# Patient Record
Sex: Female | Born: 1960 | Race: White | Hispanic: No | Marital: Single | Smoking: Current every day smoker
Health system: Southern US, Community
[De-identification: ages and names within clinical notes are randomized; demographics above are authoritative.]

## PROBLEM LIST (undated history)

## (undated) DIAGNOSIS — E119 Type 2 diabetes mellitus without complications: Secondary | ICD-10-CM

## (undated) DIAGNOSIS — K589 Irritable bowel syndrome without diarrhea: Secondary | ICD-10-CM

## (undated) DIAGNOSIS — G4731 Primary central sleep apnea: Secondary | ICD-10-CM

## (undated) DIAGNOSIS — M797 Fibromyalgia: Secondary | ICD-10-CM

## (undated) DIAGNOSIS — R5382 Chronic fatigue, unspecified: Secondary | ICD-10-CM

## (undated) DIAGNOSIS — F32A Depression, unspecified: Secondary | ICD-10-CM

## (undated) DIAGNOSIS — G629 Polyneuropathy, unspecified: Secondary | ICD-10-CM

## (undated) DIAGNOSIS — M722 Plantar fascial fibromatosis: Secondary | ICD-10-CM

## (undated) DIAGNOSIS — G8929 Other chronic pain: Secondary | ICD-10-CM

## (undated) DIAGNOSIS — Z8719 Personal history of other diseases of the digestive system: Secondary | ICD-10-CM

## (undated) DIAGNOSIS — I10 Essential (primary) hypertension: Secondary | ICD-10-CM

## (undated) DIAGNOSIS — R682 Dry mouth, unspecified: Secondary | ICD-10-CM

## (undated) DIAGNOSIS — M1990 Unspecified osteoarthritis, unspecified site: Secondary | ICD-10-CM

## (undated) DIAGNOSIS — G2581 Restless legs syndrome: Secondary | ICD-10-CM

## (undated) DIAGNOSIS — C719 Malignant neoplasm of brain, unspecified: Secondary | ICD-10-CM

## (undated) DIAGNOSIS — T4145XA Adverse effect of unspecified anesthetic, initial encounter: Secondary | ICD-10-CM

## (undated) DIAGNOSIS — T8859XA Other complications of anesthesia, initial encounter: Secondary | ICD-10-CM

## (undated) DIAGNOSIS — K760 Fatty (change of) liver, not elsewhere classified: Secondary | ICD-10-CM

## (undated) DIAGNOSIS — J45909 Unspecified asthma, uncomplicated: Secondary | ICD-10-CM

## (undated) DIAGNOSIS — M5030 Other cervical disc degeneration, unspecified cervical region: Secondary | ICD-10-CM

## (undated) DIAGNOSIS — F329 Major depressive disorder, single episode, unspecified: Secondary | ICD-10-CM

## (undated) DIAGNOSIS — F419 Anxiety disorder, unspecified: Secondary | ICD-10-CM

## (undated) DIAGNOSIS — Z8711 Personal history of peptic ulcer disease: Secondary | ICD-10-CM

## (undated) HISTORY — DX: Other cervical disc degeneration, unspecified cervical region: M50.30

## (undated) HISTORY — PX: BIOPSY THYROID: PRO38

## (undated) HISTORY — DX: Dry mouth, unspecified: R68.2

## (undated) HISTORY — PX: BREAST BIOPSY: SHX20

## (undated) HISTORY — PX: REDUCTION MAMMAPLASTY: SUR839

## (undated) HISTORY — PX: ABDOMINAL HYSTERECTOMY: SHX81

## (undated) HISTORY — PX: TUBAL LIGATION: SHX77

## (undated) HISTORY — DX: Chronic fatigue, unspecified: R53.82

## (undated) HISTORY — DX: Personal history of other diseases of the digestive system: Z87.19

## (undated) HISTORY — DX: Malignant neoplasm of brain, unspecified: C71.9

## (undated) HISTORY — DX: Plantar fascial fibromatosis: M72.2

## (undated) HISTORY — DX: Essential (primary) hypertension: I10

## (undated) HISTORY — DX: Major depressive disorder, single episode, unspecified: F32.9

## (undated) HISTORY — DX: Polyneuropathy, unspecified: G62.9

## (undated) HISTORY — DX: Irritable bowel syndrome, unspecified: K58.9

## (undated) HISTORY — DX: Type 2 diabetes mellitus without complications: E11.9

## (undated) HISTORY — DX: Unspecified osteoarthritis, unspecified site: M19.90

## (undated) HISTORY — PX: TONSILLECTOMY: SUR1361

## (undated) HISTORY — DX: Fatty (change of) liver, not elsewhere classified: K76.0

## (undated) HISTORY — DX: Other chronic pain: G89.29

## (undated) HISTORY — DX: Fibromyalgia: M79.7

## (undated) HISTORY — PX: BREAST REDUCTION SURGERY: SHX8

## (undated) HISTORY — DX: Primary central sleep apnea: G47.31

## (undated) HISTORY — DX: Restless legs syndrome: G25.81

## (undated) HISTORY — DX: Depression, unspecified: F32.A

## (undated) HISTORY — DX: Unspecified asthma, uncomplicated: J45.909

## (undated) HISTORY — DX: Personal history of peptic ulcer disease: Z87.11

## (undated) HISTORY — DX: Anxiety disorder, unspecified: F41.9

---

## 1999-01-28 ENCOUNTER — Other Ambulatory Visit: Admission: RE | Admit: 1999-01-28 | Discharge: 1999-01-28 | Payer: Self-pay | Admitting: Obstetrics & Gynecology

## 1999-01-28 ENCOUNTER — Encounter (INDEPENDENT_AMBULATORY_CARE_PROVIDER_SITE_OTHER): Payer: Self-pay | Admitting: *Deleted

## 1999-03-04 ENCOUNTER — Encounter (INDEPENDENT_AMBULATORY_CARE_PROVIDER_SITE_OTHER): Payer: Self-pay

## 1999-03-04 ENCOUNTER — Ambulatory Visit (HOSPITAL_COMMUNITY): Admission: RE | Admit: 1999-03-04 | Discharge: 1999-03-04 | Payer: Self-pay | Admitting: Obstetrics & Gynecology

## 1999-08-24 ENCOUNTER — Inpatient Hospital Stay (HOSPITAL_COMMUNITY): Admission: RE | Admit: 1999-08-24 | Discharge: 1999-08-27 | Payer: Self-pay | Admitting: Obstetrics & Gynecology

## 2000-05-01 ENCOUNTER — Inpatient Hospital Stay (HOSPITAL_COMMUNITY): Admission: EM | Admit: 2000-05-01 | Discharge: 2000-05-02 | Payer: Self-pay | Admitting: *Deleted

## 2004-01-24 ENCOUNTER — Ambulatory Visit: Payer: Self-pay | Admitting: Psychiatry

## 2004-01-24 ENCOUNTER — Other Ambulatory Visit (HOSPITAL_COMMUNITY): Admission: RE | Admit: 2004-01-24 | Discharge: 2004-02-07 | Payer: Self-pay | Admitting: Psychiatry

## 2004-04-23 ENCOUNTER — Other Ambulatory Visit (HOSPITAL_COMMUNITY): Admission: RE | Admit: 2004-04-23 | Discharge: 2004-05-04 | Payer: Self-pay | Admitting: Psychiatry

## 2004-04-23 ENCOUNTER — Ambulatory Visit: Payer: Self-pay | Admitting: Psychiatry

## 2004-06-02 ENCOUNTER — Ambulatory Visit: Payer: Self-pay | Admitting: Psychiatry

## 2004-06-02 ENCOUNTER — Inpatient Hospital Stay (HOSPITAL_COMMUNITY): Admission: RE | Admit: 2004-06-02 | Discharge: 2004-06-10 | Payer: Self-pay | Admitting: Psychiatry

## 2007-03-18 ENCOUNTER — Inpatient Hospital Stay (HOSPITAL_COMMUNITY): Admission: EM | Admit: 2007-03-18 | Discharge: 2007-03-21 | Payer: Self-pay | Admitting: Emergency Medicine

## 2007-03-18 ENCOUNTER — Ambulatory Visit: Payer: Self-pay | Admitting: Family Medicine

## 2007-03-29 ENCOUNTER — Emergency Department (HOSPITAL_COMMUNITY): Admission: EM | Admit: 2007-03-29 | Discharge: 2007-03-29 | Payer: Self-pay | Admitting: Emergency Medicine

## 2009-03-19 IMAGING — CR DG CHEST 1V PORT
1 series · 1 of 1 positions shown · non-contrast
Comparison: None.

CLINICAL DATA: Overdose.  Hypotensive.
 PORTABLE SUPINE CHEST - 1 VIEW - 03/18/07 AT [DATE] P.M.:

[AP]
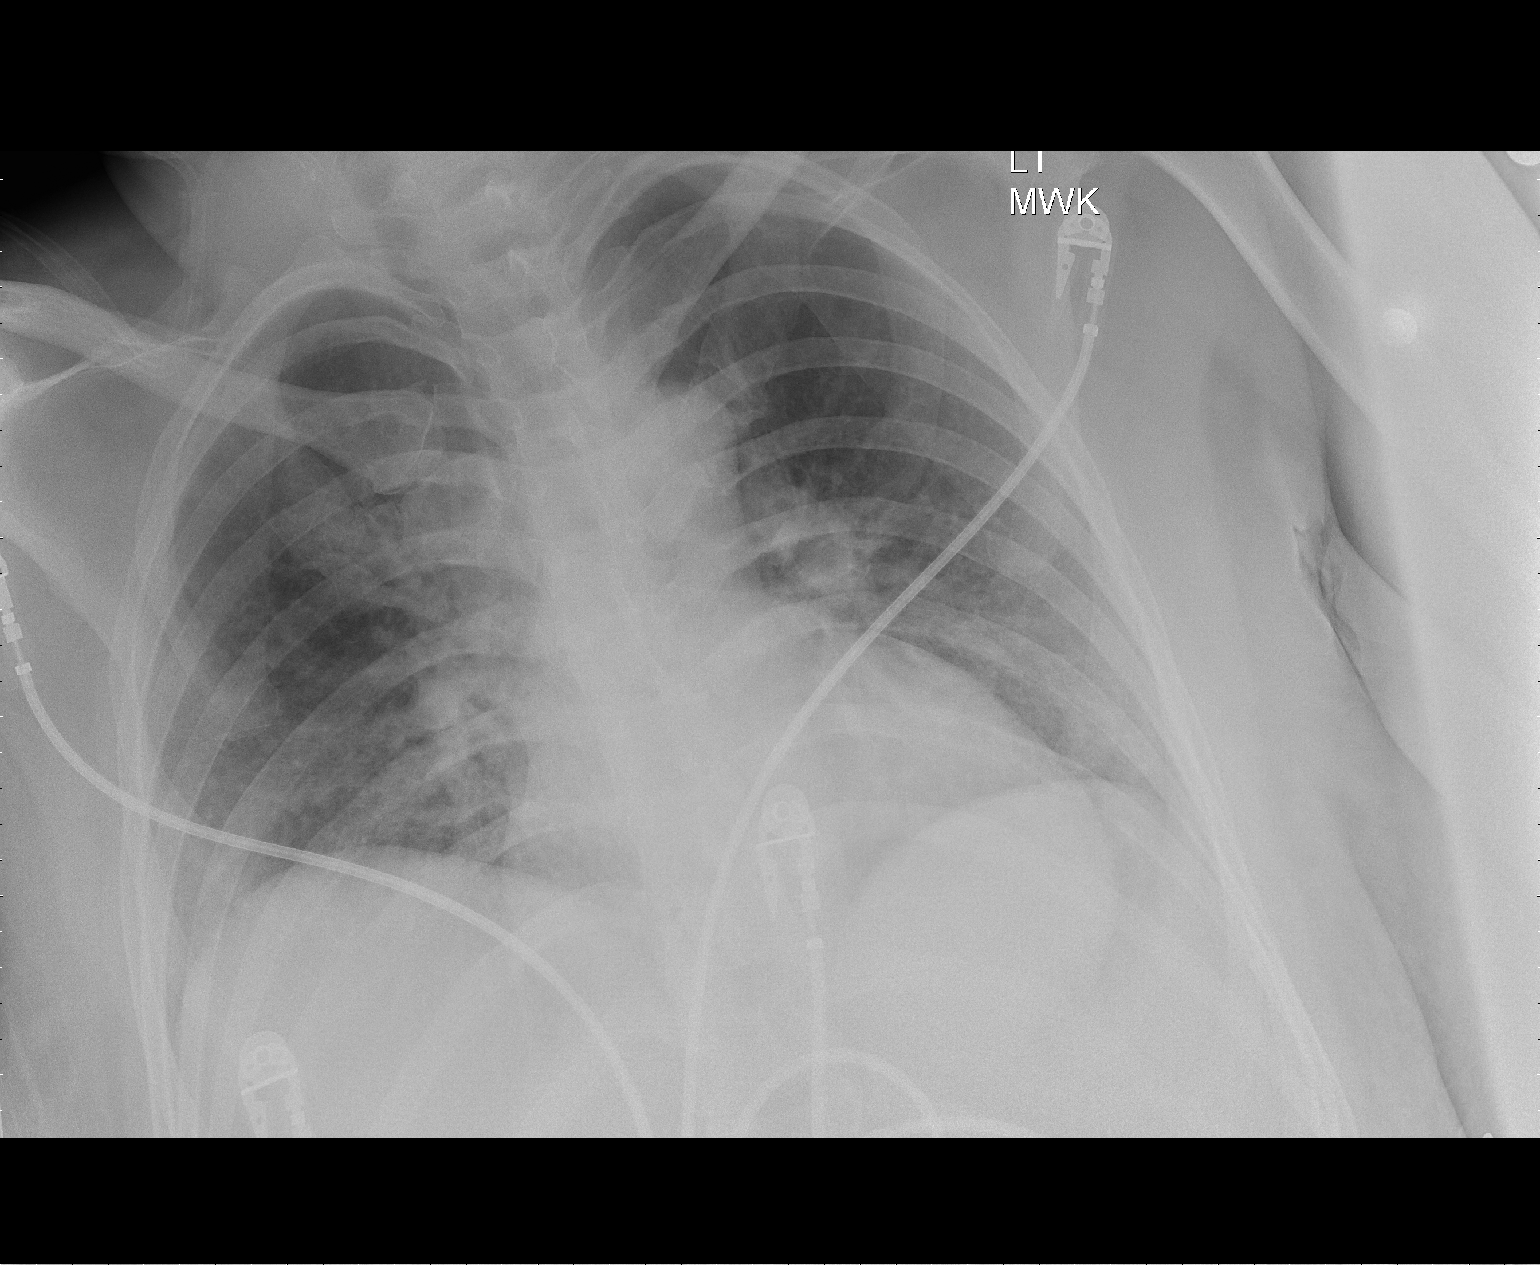

[1 of 1 positions shown; findings below may reference images not displayed]

FINDINGS: Cardiomegaly and mild pulmonary edema.  No gross pneumothorax or segmental region of consolidation.  Recommend follow up until clearance.
IMPRESSION: Poor inspiratory exam with cardiomegaly and mild pulmonary edema most notable centrally.

## 2010-09-01 NOTE — H&P (Signed)
NAMEDENISHIA, CITRO NO.:  [id number]   MEDICAL RECORD NO.:  [id number]          PATIENT TYPE:  EMS   LOCATION:  MAJO                         FACILITY:  [facility]   PHYSICIAN:  Altamese Cabal, M.D.  DATE OF BIRTH:  07-Apr-1961   DATE OF ADMISSION:  03/18/2007  DATE OF DISCHARGE:                              HISTORY & PHYSICAL   PRIMARY CARE PHYSICIAN:  [facility].   CHIEF COMPLAINT:  Overdose.   HISTORY OF PRESENT ILLNESS:  This patient is a 50 year old female with  bipolar disorder, prior suicide attempts, hypertension and type 2  diabetes brought in by EMS after attempting suicide.  She apparently  took about 30 50 mg Seroquel tablets, 30 500 mg metformin ER tablets and  13 5 mg lisinopril tablets around 2:00 p.m. today.  She then called her  mother to say goodbye and her mother called the police.  The patient  left a suicide note.  The patient was alone at the time of the event.  In the emergency room she had an episode of asystole times 4-5 seconds  with spontaneous return to normal sinus rhythm.  So far she has been  given sodium bicarb x1, Narcan x1, mag sulfate x1 and KCl one run.  She  is status post about 2 liters currently on D5 normal saline at 150  cc/hour.   PAST MEDICAL HISTORY:  1. Hypertension.  2. Type 2 diabetes.  3. Bipolar disorder.  4. Suicide attempts with overdose in February 2006.   MEDICATIONS:  1. Clonazepam 2 mg p.o. t.i.d.  2. Skelaxin 800 mg one half to one tablet p.o. t.i.d.  3. Omeprazole 20 mg p.o. b.i.d.  4. Zoloft 200 mg p.o. q.a.m.  5. Bupropion XL 300 mg p.o. q.a.m.  6. Lamictal 300 mg p.o. q.a.m.  7. Aspirin 81 mg p.o. daily.  8. Seroquel 50 mg two to six tablets p.o. nightly.  9. Naprosyn 500 mg p.o. b.i.d.  10.Xanax 1 mg p.o. b.i.d.  11.Metformin ER 500 mg p.o. b.i.d.  12.Lisinopril 5 mg p.o. daily.  13.Topamax 25 mg p.o. nightly.   ALLERGIES:  SULFA DRUGS.   FAMILY HISTORY:  Unobtainable.   SOCIAL  HISTORY:  She lives with two teenage sons who were out of town  this weekend.  Unable to get any more history from the patient.  She has  a mother who lives in [place].   PHYSICAL EXAM:  VITAL SIGNS: Temperature 98.5, blood pressure 82-102/44-  69, pulse 68-80, respirations 16-18, O2 96% on 2 liters.  GENERAL:  The patient is sleepy but arousable.  Her speech is slurred  but she is oriented x3.  HEENT: Head normocephalic atraumatic.  Pupils equal, round and reactive  to light and accommodation.  Extraocular movements intact.  Dry mucous  membranes.  CARDIOVASCULAR:  Regular rate and rhythm.  No murmurs, rubs or gallops.  LUNGS:  Clear to auscultation bilaterally.  ABDOMEN:  Positive bowel sounds.  Soft, nontender, nondistended.  EXTREMITIES: No clubbing, cyanosis or edema.  NEURO:  She follows simple commands.  She is moving all her  extremities.   LABS AND STUDIES:  White count 9.1, hemoglobin 13.0, hematocrit 37.4,  platelets 263.  Sodium 139, potassium 3.1, chloride 109, bicarb 25, BUN  8, creatinine 0.86, glucose 119.  Salicylates less than 4.0.  Alcohol  level less than 5.  UDS pending.  Tricyclics pending.  CBG 138-80-87.   ASSESSMENT/PLAN:  This is a 50 year old female status post overdose.  1. Overdose.  We will admit her to the step-down unit.  We need to      closely monitor her cardiac status on telemetry.  We will repeat an      EKG tonight.  Check a lactic acid level and ABG now.  Check      ___BMET_______  every three hours until stable.  Need to watch the      patient for seizures, lactic acidosis, worsening hypotension and      respiratory depression.  2. Hypotension.  The patient is status post 2 liters.  Monitor her      closely.  Bolus as needed.  Start dopamine drip if not responding      to fluids.  3. Hypoxemia.  This is likely due to respiratory depression from      Seroquel.  Continue O2 to keep saturation greater than 92%.  4. Hypoglycemia.  Will check CBG  every hour and then space them out if      they are stable.  Continue D5 normal saline.  5. Hypokalemia.  We will replete this and recheck this.  6. Fluid, electrolytes and nutrition.  The patient be n.p.o.  7. Prophylaxis.  Will put SCDs to her lower extremities.      Altamese Cabal, M.D.  Electronically Signed     KS/MEDQ  D:  03/18/2007  T:  03/19/2007  Job:  [id number]

## 2010-09-01 NOTE — Discharge Summary (Signed)
Tami Turner, Tami Turner                ACCOUNT NO.:  [id number]   MEDICAL RECORD NO.:  [id number]          PATIENT TYPE:  INP   LOCATION:  5004                         FACILITY:  [facility]   PHYSICIAN:  Zenaida Deed. Mayford Knife, M.D.DATE OF BIRTH:  1961/02/28   DATE OF ADMISSION:  03/18/2007  DATE OF DISCHARGE:  03/21/2007                               DISCHARGE SUMMARY   REASON FOR ADMISSION:  Suicide attempt with overdose of metformin,  lisinopril, and Seroquel.   COURSE OF TREATMENT RENDERED:  [name] was admitted on March 18, 2007, after arrival via EMS after attempting a suicide attempt by taking  her metformin, Seroquel, and lisinopril and significant doses.  She was  actually found to have suicide note as well.  In the emergency room, she  actually has not episode of systole times 4-5 seconds, sound was  spontaneous return to normal sinus rhythm.  She was administered sodium  bicarbonate x1, Norocaine x1, magnesium sulfate x1, and KCl for 1 month,  and was hydrated aggressively prior to admission to the stepdown unit.  We closely monitored her cardiac status with telemetry.  She had no  further events.  We watched her closely from any seizures, lactic  acidosis, or change in her hypertension, or respiratory depression.  She  did require dopamine for short period of time secondary to hypertension  and was able to be successfully weaned from the dopamine.  At that  point, once we had her medically cleared, we consulted Dr. Jeanie Sewer of  psychiatry to asses her and recommended that once medically cleared she  will be discharged with followup and an intense outpatient psych  followup.  She remain stable throughout her hospitalization after  initial stabilization and was discharged in stable condition.   PERTINENT FINDINGS:  At admission, her CBC was within normal limits with  the white blood cell count of 9.1, hemoglobin 13.0, platelet count  263,000, and normal differential.   Acetaminophen, alcohol, and  salicylate levels were all within normal limits.  Comprehensive  metabolic panel revealed hypokalemia with potassium of 3.1 and  hyperglycemia glucose to 119.  LFTs were within normal limits.  However,  her albumin was very low at less than 1.  Blood gas was drawn as well at  admission and was within normal limits.  Urine drug screen at admission  was positive for benzodiazepine and urine tricyclic screen was also  positive.  Venous lactic acid was drawn and was within normal limits at  1.8.  Repeat metabolic panel after IV fluids and potassium revealed  improved potassium to 3.8, and continue hyperglycemia at 126.  At the  time of discharge, basic metabolic panel revealed sodium 135, potassium  4.0, chloride 101, bicarb 22, glucose 178, BUN 8, creatinine 0.81, and  calcium 9.2.   COMPLICATIONS AND INFECTIONS:  Above is her summary.  No true  complications that she did require dopamine for pressure support for a  short period of time.   FINAL DIAGNOSIS:  Suicide attempt with metformin, Seroquel, and  lisinopril.   SECONDARY DIAGNOSES:  1. Severe clinical depression.  2.  Diabetes mellitus.  3. Hypertension.   CONDITION ON DISCHARGE:  Improved.   DISCHARGE MEDICATIONS:  1. Premarin as needed.  2. Wellbutrin XL 300 mg p.o. daily.  3. Metformin ER 500 mg p.o. b.i.d.  4. Klonopin 1 mg p.o. t.i.d.  5. Deplin 7.5 mg p.o. daily.  6. Zoloft 100 mg p.o. daily.  7. Skelaxin 800 mg p.r.n.  8. Naproxen 500 mg p.r.n.  9. Omeprazole 20 mg p.o. daily to b.i.d.  10.Lamictal 300 mg p.o. daily.  11.Aspirin 81 mg p.o. daily.  12.Vitamin.  13.Seroquel 50 mg 2-6 tablets p.o. at bedtime.  At this point, we will      hold her lisinopril and her Xanax with a thought that as her blood      pressure continues to improve and given her diabetes that she will      likely benefit from restarting her lisinopril.   INSTRUCTIONS:  The patient is to return to [facility] Hal [facility] at  [facility] as scheduled on March 24, 2007, at 11:30 a.m. and she  is to setup appointments for intensive outpatient psychotherapy as  instructed.  She is to follow a diabetic diet and has no restrictions  with regard to her activity.      Ancil Boozer, MD  Electronically Signed      Zenaida Deed. Mayford Knife, M.D.  Electronically Signed    SA/MEDQ  D:  03/21/2007  T:  03/22/2007  Job:  [id number]   cc:   Antonietta Breach, M.D.  Marcos Eke. Hal Hope, M.D.

## 2010-09-01 NOTE — Consult Note (Signed)
Tami Turner, Tami Turner NO.:  [id number]   MEDICAL RECORD NO.:  [id number]          PATIENT TYPE:  INP   LOCATION:  5004                         FACILITY:  [facility]   PHYSICIAN:  Antonietta Breach, M.D.  DATE OF BIRTH:  1961-01-24   DATE OF CONSULTATION:  03/20/2007  DATE OF DISCHARGE:                                 CONSULTATION   REQUESTING PHYSICIAN:  Pearlean Brownie, M.D.   REASON FOR CONSULTATION:  Suicide attempt with overdose.   HISTORY OF PRESENT ILLNESS:  Tami Turner is 50 year old female  admitted to the [facility] on March 18, 2007, after a  polysubstance overdose.   The patient describes having a stable mood with normal interests and job  function under the care of her outpatient psychiatrist, until 3 days ago  when she had an argument with a female friend that she has been dating.  She found out that he was seeing two other people. He had not been open  about this, and she was emotionally devastated.  She left a suicide note  and took multiple tablets of metformin, 13 tablets of 5 mg lisinopril  and an unknown amount of Seroquel.  She then called her mother.  The  patient also states that she called 9-1-1.  The patient's mother called  9-1-1 as well.   After spending a few days in the hospital, the patient has had multiple  discussions with a supportive family members.  She is not having any  thoughts of harming herself, and after the experience of overdose and  recovering in the hospital, she has a constructive level of regret about  the overdose.  She realizes that she made an impulsive destructive act  and now has come back to her constructive judgment.   She has constructive future goals.  She has intact interests.  She is  not having any hallucinations or delusions.  She does not have any  thoughts of harming herself or others.  The patient reinforces that the  anti-depression regimen that she has been placed on by her outpatient  psychiatrist has been working well and was developed after years of  trying multiple other regimens.  Please see the discussion below.   The patient has regularly required 2 to 3 mg of Klonopin per day for her  anxiety symptoms, which continue to be feeling on edge, muscle tension,  and some excess worry.  She has been through an extensive psychotherapy  and is taking Zoloft 200 mg daily, in addition to other psychotropic  medications.  Regarding the relationship with the female friend, she is  resolved about not continuing with a relationship.   PAST PSYCHIATRIC HISTORY:  In review of the past medical record, there  is a mentioning of possible bipolar disorder.  The patient has had a  number of episodes of depression.   In January 2002, she was admitted to [facility] for  depression.  In February 2006, she took an overdose when depressed.   Since that time, she has been seen by a new psychiatrist.  They changed  her  medicines and proceeded with combination anti-depression treatment,  eventually settling on Zoloft 200 mg daily, Wellbutrin 300 mg daily and  Lamictal 300 mg daily.  The patient does take 1 to 3 mg of Klonopin per  day, for anti-acute anxiety as mentioned above.   She has been through cognitive behavioral therapy.   The patient states that she has been exceptionally stable since being on  this regimen.  She had a sudden emotional reaction to the news from her  boyfriend, which precipitated the overdose currently.  See the history  of present illness above.   Other past psychotropic trials include Effexor, Xanax, Geodon, Ativan  and Vistaril.   The general medical record listed Lamictal as a drug allergy; however,  the patient confirms that she had been taking it regularly.   FAMILY PSYCHIATRIC HISTORY:  None known.   SOCIAL HISTORY:  Tami Turner works as a Comptroller.  She has two children  ages 32 and 24.  She lives at home with them.  She does not  use alcohol  or illegal drugs.   PAST MEDICAL HISTORY:  Status post polysubstance overdose.   ALLERGIES:  SULFA.  THE MEDICAL RECORD LISTS LAMICTAL ACUTELY; HOWEVER,  THE PATIENT STATES THAT SHE HAS BEEN TAKING IT REGULARLY.   MEDICATIONS:  MAR is reviewed.  The patient is currently on Klonopin 1  mg t.i.d., Nicoderm 21 mg per day patch, Zoloft 200 mg daily.   LABORATORY DATA:  WBC 9.1, hemoglobin 13, platelet count 363, sodium  138, BUN 4, creatinine 0.89, aspirin negative, alcohol negative, SGOT  19, SGPT 14, Tylenol negative.  Phosphorus within normal limits.  Magnesium within normal limits.  Tricyclic anti-depressant positive.   REVIEW OF SYSTEMS:  CONSTITUTIONAL:  Afebrile.  No weight loss.  HEAD:  No trauma.  EYES:  No visual changes.  EARS:  No hearing impairment.  NOSE:  No rhinorrhea.  MOUTH/THROAT:  No sore throat.  NEUROLOGIC:  No  focal motor or sensory changes.  PSYCHIATRIC:  As above.  CARDIOVASCULAR:  The patient did have a 5-second asystole episode in the  emergency room and spontaneously recovered.  RESPIRATORY:  No coughing  or wheezing.  GASTROINTESTINAL:  No nausea, vomiting, diarrhea.  GENITOURINARY:  No dysuria.  SKIN:  Unremarkable.  ENDOCRINE/METABOLIC:  No heat or cold intolerance.  MUSCULOSKELETAL:  No deformities.  HEMATOLOGIC/LYMPHATIC:  unremarkable.   EXAMINATION:  VITAL SIGNS:  Temperature 98.0, pulse 93, respiratory rate  19, blood pressure 122/82, O2 saturation on room air 97%.  GENERAL APPEARANCE:  Tami Turner is a middle-aged female sitting up in her  hospital bed with no abnormal involuntary movements.   OTHER MENTAL STATUS EXAM:  Tami Turner is alert.  She has good eye  contact.  Her attention span is normal.  Her concentration is within  normal limits.  Her affect is slightly anxious.  Mood is slightly  anxious.  She is oriented completely to all spheres.  Her memory is  intact to immediate, recent, and remote, except for the overdose  blackout  period.  Fund of knowledge and intelligence are above average.  Speech involves normal rate and prosody without dysarthria.  Thought  process:  Logical, coherent, goal-directed, no looseness of  associations.  Thought content:  No thought of harming herself, no  thoughts of harming others, no delusions, no hallucinations.  Abstraction is intact. Language, expression, and comprehension are  intact.  Insight is intact.  Judgment is within normal limits.  The  patient is socially  appropriate and cooperative.   ASSESSMENT:  Axis I:  293.83, mood disorder not otherwise specified.  Major depressive disorder, recurrent, in partial remission.  293.84, anxiety disorder not otherwise specified, rule out generalized  anxiety disorder.  Axis II:  Deferred.  Axis III:  See general medical section above.  Axis IV:  Primary support group.  Axis V:  55.   Tami Turner is no longer at risk to harm herself, after recovering from  her short-lived reactive emotional response to her boyfriend's news.  She agrees to call emergency services immediately for any thoughts of  harming herself or other psychiatric emergency symptoms.   The undersigned provided ego supportive psychotherapy and education.  The undersigned recommended inpatient psychiatric care for the most  intensive therapy and support; however, the patient declined and she is  no longer committable, now that she has returned to her stable mental  status.   However, the patient does acknowledge that she does need help with  coping skills and stress management.  She does agree to an intensive  outpatient program, starting upon discharge.   RECOMMENDATIONS:  1. The patient will continue on her outpatient psychotropic regimen,      prescribed by her outpatient psychiatrist.  2. She requires a return to psychotherapy.  This will start with her      IOP (intensive outpatient program), which will provide      reinforcement of coping skills and  stress management, as well as      psychotropic medication being addressed by the psychiatrist in the      program.      Antonietta Breach, M.D.  Electronically Signed     JW/MEDQ  D:  03/20/2007  T:  03/20/2007  Job:  [id number]

## 2010-09-04 NOTE — Op Note (Signed)
[facility]  Patient:    Tami Turner                          MRN: [id number] Proc. Date: 03/04/99 Adm. Date:  [id number] Attending:  Genia Del                           Operative Report  PREOPERATIVE DIAGNOSIS:       Abnormal uterine bleeding with endometrial lesion, probable polyp.  POSTOPERATIVE DIAGNOSIS:      Abnormal uterine bleeding with endometrial lesion, probable polyp.   Endometrial polyp present.  OPERATION:                    Hysteroscopy with dilatation and curettage and resection of polyp.  SURGEON:                      Genia Del, M.D.  ASSISTANT:  ANESTHESIA:                   Belva Agee, M.D.  ESTIMATED BLOOD LOSS:  DESCRIPTION OF PROCEDURE:     Under general anesthesia with endotracheal intubation, the patient is in the lithotomy position.  The patient is prepped with Betadine on the suprapubic, vulvar, and vaginal areas.  The patient is draped as usual.  The vaginal examination reveals an anteverted uterus, slightly globby, mobile.  No adnexal mass.  The cervix is normal.  The speculum is introduced. he anterior lip of the cervix is grasped with a tenaculum.  The cervix is dilated until Hegar #36 and then the hysteroscope is introduced into the uterine cavity. Inspection of the uterine cavity reveals an endometrial polyp on the posterior eft side of the uterus.  The two ostia are well visualized.  No other lesion is present.  We then proceeded with the curettage with a sharp curet.  All of the endometrial surfaces are curetted systematically.  The specimen is sent to pathology.  The endometrium was very thick.  Then the endometrial polyp is resected with the resectoscope and then the area is coagulated to control hemostasis. The specimen is sent to pathology.  Then visualization of the uterine cavity is accomplished and is normal.  The area where the polyp was resected is pictured.  The  instruments are then removed.  No bleeding is present.  The estimated blood  loss is 25 cc.  The fluid deficit is 300 cc, but some fluid was lost to the floor. No complications occurred. DD:  03/04/99 TD:  03/05/99 Job: [id number]

## 2010-09-04 NOTE — Discharge Summary (Signed)
[facility]  Patient:    Tami Turner, Tami Turner                         MRN: [id number] Adm. Date:  [id number] Disc. Date: [id number] Attending:  Genia Del                           Discharge Summary  ADMISSION DIAGNOSES:          1. 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                               2. Right vulvar laceration secondary to delivery                                  with superficial dyspareunia.  PREOPERATIVE DIAGNOSIS:       1. 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                               2. Right vulvar laceration secondary to delivery                                  with superficial dyspareunia.  POSTOPERATIVE DIAGNOSIS:      1. 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                               2. Right vulvar laceration secondary to delivery                                  with superficial dyspareunia.  PROCEDURE:                    On Aug 24, 1999, subtotal hysterectomy with right labioplasty, preservation of adnexa.  No complications.  HOSPITAL COURSE:              Postoperative evaluation unremarkable. Cardiovascularly stable, afebrile.  Hemoglobin postoperative was 9.4 with hematocrit 29.5.  The patient was discharged on postoperative day #3 with postoperative advises and follow-up organized at [facility] in three weeks.  DISCHARGE MEDICATIONS:        1. Vioxx p.r.n.                               2. Iron supplement.  DISCHARGE DIAGNOSES:          1. Menorrhagia secondary to symptomatic uterine  myomas.                               2. Right vulvar laceration secondary to delivery                                  with superficial dyspareunia.                               3. No postoperative complications. DD:   09/11/99 TD:  09/13/99 Job: [id number]

## 2010-09-04 NOTE — H&P (Signed)
Tami Turner, Tami Turner NO.:  [id number]   MEDICAL RECORD NO.:  [id number]          PATIENT TYPE:  IPS   LOCATION:  0304                          FACILITY:  [facility]   PHYSICIAN:  Jeanice Lim, M.D. DATE OF BIRTH:  1960-06-11   DATE OF ADMISSION:  06/02/2004  DATE OF DISCHARGE:  06/10/2004                         PSYCHIATRIC ADMISSION ASSESSMENT   IDENTIFYING INFORMATION:  This 50 year old divorced white female voluntarily  admitted on June 02, 2004.   HISTORY OF PRESENT ILLNESS:  The patient presents with a history of an  intentional overdose, overdosing on Propranolol and Ativan, Vistaril and  Klonopin on Friday.  Patient states that if I died it would be a good  thing.  Patient has never thought that she would think of killing herself.  Patient states that when she had told her psychiatrist about the overdose  that one medication was more worrisome than the others.  Patient states she  took 5 more that day, which was on Monday, February 13.  Patient was feeling  very unsafe, was continuing to have suicidal thoughts.  She has been taking  excessive amounts of Ativan to control her anxiety and help her sleep.  Patient is contracted for safety, feels safe in the facility.  Patient  states that she had gone for an assessment to get ECT the following week.   PAST PSYCHIATRIC HISTORY:  First admission to the [facility].  She sees Dr. Kathrynn Running as an outpatient, has a history of bipolar disorder.   SOCIAL HISTORY:  This is a 50 year old divorced white female who has two  children, ages 23 and 75.  Children are with her ex-husband.  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  She lives with her children, otherwise.  Patient works at the [employer] at [employer].   FAMILY HISTORY:  Unclear.   ALCOHOL OR DRUG HISTORY:  Nonsmoker, denies any alcohol or drug use.   PRIMARY CARE PHYSICIAN:  [facility].   MEDICAL PROBLEMS:  Asthma.   MEDICATIONS:  1.  An albuterol inhaler.  2.  Zoloft 200 mg daily.  3.  Geodon 20 mg.  4.  Wellbutrin.  5.  Ativan.  6.  Vistaril.  Dosages are unclear at this time.   DRUG ALLERGIES:  SULFA and LAMICTAL, patient reports a rash.   PHYSICAL EXAMINATION:  VITAL SIGNS:  Temperature 97.7, pulse 83, respiratory  rate 16, blood pressure 137/83, 5 feet 7-1/2 inches tall, 200 pounds.  GENERAL:  This is an overweight, well-nourished female.  Patient has long  brown hair which is clean and styled.  Negative lymphadenopathy.  CHEST:  Clear.  BREAST:  Exam is deferred.  HEART:  Regular rate and rhythm.  ABDOMEN:  Soft.  EXTREMITIES:  Moves all extremities 5+ against resistance, no clubbing, no  edema.  SKIN:  Warm and dry.  NEUROLOGIC:  Findings are intact.  Nonfocal.  Patient easily able to  performed heel-to-shin and normal alternating movements.   REVIEW OF SYSTEMS:  Patient denies any chest pain, shortness of breath,  reports increased  weight gain of approximately 20 pounds.   PAST MEDICAL HISTORY:  Asthma.   LABORATORY DATA:  CBC is within normal limits.  Sodium 134, glucose 108,  albumin 3.3.  TSH is 4.227.  Urine drug screen is positive for  benzodiazepines.  Urine pregnancy test is negative.   MENTAL STATUS EXAM:  Alert, middle-aged female cooperative.  Fair eye  contact.  Casually dressed.  Speech is clear and articulate, normal pace and  tone.  Mood is depressed.  Affect is sad.  Thought process are coherent.  No  evidence of psychosis.  Cognitive function is intact.  Memory is good.  Judgment and insight are fair.  Appears to have above-average intelligence.   DIAGNOSES:   AXIS I:  Bipolar disorder.   AXIS II:  Deferred.   AXIS III:  Asthma.   AXIS IV:  Other psychosocial problems.   AXIS V:  Current is 25.  Estimated this past year 68.   PLAN:  Admission for suicidal attempt.  Contact for safety.  We will detox  off Ativan.  We will  stabilize mood and thinking.  We will begin to decrease  patient's Zoloft tapering over the next several days.  We will add  Wellbutrin and Cymbalta initially 30 mg in divided doses.  We will also add  some Risperdal for some ruminating self-destructive thoughts.  We will  consider ECT as scheduled.  Tentative length of stay is 6-7 days.      JO/MEDQ  D:  06/10/2004  T:  06/10/2004  Job:  [id number]

## 2010-09-04 NOTE — Op Note (Signed)
[facility]  Patient:    Tami Turner, Tami Turner                         MRN: [id number] Adm. Date:  [id number] Attending:  Genia Del                           Operative Report  DATE OF BIRTH:                1961-03-27  PREOPERATIVE DIAGNOSIS:       Menorrhagia secondary to symptomatic uterine myoma and right vulvar laceration secondary to delivery associated with superficial dyspareunia.  POSTOPERATIVE DIAGNOSIS:      Menorrhagia secondary to symptomatic uterine myoma and right vulvar laceration secondary to delivery associated with superficial dyspareunia.  ANESTHETIST:                  Gretta Cool., M.D.  ASSISTANT:                    Sheronette A. Cherly Hensen, M.D.  SURGEON:                      Reuben Likes, M.D.  PROCEDURE:                    Under general anesthesia with endotracheal intubation the patient is in the lithotomy position.  She is prepped with Betadine on the abdominal, suprapubic, vulvar, and vaginal areas.  A bladder catheter is introduced and the patient is draped as usual.  A Pfannenstiel is used using the scalpel.  The aponeurosis is opened with curved Mayo scissors transversely.  The aponeurosis is separated from the recti muscle upward and downward.  We then opened the peritoneum longitudinally with the Metzenbaum scissors.  The retractor is placed and the bowels are retracted upward with three laps.  We then put Kelly clamps on each side of the uterus.  The ovaries are normal in appearance and size.  The tubes show status post tubal sterilization.  The uterus is enlarged with uterine myomas corresponding to around a 10 week pregnancy.  We start at the level of the round ligaments. They are sutured with transfixed Vicryl 0.  We then cut with the electrocautery and opened the interior peritoneum and retracted the bladder downward.  We then visualized the ureters on both sides which are normal anatomy position  and very low, out of the surgical field.  We then created a window in the broad ligaments on each side.  We clamped the utero-ovarian ligament with the ______ clamp x 2.  We cut in the middle and sutured with Vicryl 0 free tie and doubled that suture.  We do the same on both sides.  We then skeletonize the uterine arteries.  We clamp with curved ______ on each side.  We double it and we suture with Vicryl 0 x 2 on each side.  We are already at the level of the cervix and since we are doing a subtotal, the angled scalpel is used to section at the level of the internal os.  We remove the uterus and send it for pathology.  We then use the scalpel and the Mayo scissors to cut a cone in an attempt to remove all the endometrium.  We then burn the inside of that cone with a ball electrocautery.  We finally close the  cervix with a running locked suture with Vicryl 0.  Hemostasis is good.  We verify hemostasis on the bladder flap.  We verify hemostasis on all the pedicles at the level of the round ligaments and ovary on each side. Hemostasis is good.  The two ureters are seen and peristalsis is observed.  We irrigate the pelvic cavity and suction.  We then remove the retractor and the three laps.  An abdominal exploration is done.  The liver is soft, no lesion. The two kidneys are palpated and normal.  No palpable lymph nodes at the level of para-aortic region.  Bowels are unremarkable.  We then verify hemostasis at the level of the recti muscle.  It is completed with the electrocautery.  We close the aponeurosis with two half running sutures with Vicryl 0.  We then complete the hemostasis on the adipose tissue and reapproximate the skin with staples.  A dry dressing is applied.  We then continue at the level of the vulva with a right vulvoplasty.  We use the Metzenbaum scissors to cut the edges of the lacerated small labia.  We then suture with interrupted Vicryl 4-0 to reapproximate the two sides  of that small labia.  Hemostasis is adequate.  The estimated blood loss total was 300 cc.  The count of compress and instruments was complete x 2.  The patient was then brought to recovery room in good stages.  No complication occurred. DD:  08/24/99 TD:  08/24/99 Job: [id number]

## 2010-09-04 NOTE — Discharge Summary (Signed)
NAMEDERIONNA, Tami Turner   MEDICAL RECORD NO.:  [id number]          PATIENT TYPE:  IPS   LOCATION:  0304                          FACILITY:  [facility]   PHYSICIAN:  Jeanice Lim, M.D. DATE OF BIRTH:  Feb 09, 1961   DATE OF ADMISSION:  06/02/2004  DATE OF DISCHARGE:  06/10/2004                                 DISCHARGE SUMMARY   IDENTIFYING DATA:  This is a 50 year old divorced Caucasian female  voluntarily admitted.  Presented with a history of intentional overdose,  overdosing on propranolol, Ativan, Vistaril, Klonopin.  Reporting if I did  it, it would be a good thing.  The patient has never thought that she would  think of killing herself and stated that she told her psychiatrist about the  overdose, that one medication was worse than others.  The patient stated  that she took five more that day, which was on Monday.  Overdose was on  Friday.  The patient was feeling very unsafe, continuing to have suicidal  thoughts, taking excessive Ativan to control anxiety.  The patient was able  to contract for safety in the facility and had requested ECT and was being  set up for ECT on an outpatient basis through Dr. __________.  However,  appointment had not occurred yet.  Initial evaluation had been completed and  Dr. __________ recommended patient be an inpatient here.  First admission to  [facility].  Sees Dr. Kathrynn Running outpatient.  History of bipolar  disorder, type 2, with primary depressive and anxiety symptoms.  This is a  50 year old divorced Caucasian female with two children, ages 59 and 28.  Shares custody with ex-husband.  Masters degree in [employer].  Works  in [employer].   MEDICATIONS:  Albuterol, Zoloft, Geodon, Wellbutrin, Ativan, Vistaril.   ALLERGIES:  SULFA and LAMICTAL.   PHYSICAL EXAMINATION:  Physical and neurologic exam essentially within  normal limits.   LABORATORY DATA:  Routine  admission labs within normal limits.   MENTAL STATUS EXAM:  The patient is an alert, middle-aged female.  Cooperative.  Fair eye contact.  Casually dressed.  Speech articulate.  Normal pace.  Mood depressed.  Affect sad.  Thought processes goal directed.  Thought content negative for dangerous ideation or psychotic symptoms.  The  patient denied acute suicidal ideation with plan.  Reported feeling very  suicidal outside of the hospital and unsafe, overdosing on more than one  occasion prior to admission.   ADMISSION DIAGNOSES:   AXIS I:  Bipolar disorder, mixed, depressed phase versus major depressive  disorder, recurrent, severe.   AXIS II:  Mixed personality features, some borderline and histrionic  features only--no disorder.   AXIS III:  Asthma.   AXIS IV:  Moderate (stressors related to limited support system, some  isolation, essentially a single mom, not doing well on medications regarding  mood and anxiety for some time now).   AXIS V:   HOSPITAL COURSE:  The patient was admitted and ordered routine p.r.n.  medications and underwent further monitoring.  Was encouraged to participate  in individual, group and milieu therapy.  Was showed the ECT video to  educate and started on Wellbutrin, which was added and Zoloft tapered with  the plan to switch to Cymbalta.  The patient was adjusted on medications and  underwent detox to stop benzodiazepines as they have not been effective,  clearly worsened her desperateness and impulsivity.  The patient was  cooperative, participated fully in treatment, participated in aftercare  planning, showed a dramatic improvement with medication changes.  Was  interested in getting ECT despite having shown some improvement and affect  being much brighter.  She had episodes where she would feel terribly  depressed and down and desperate.  Therefore, wanted to pursue the  outpatient ECT.   CONDITION ON DISCHARGE:  The patient was discharged in  improved condition  with no suicidal ideation, no dangerous ideation.  Mood was euthymic and  affect bright at the time.  Risk/benefit ratio and alternative treatments  regarding medications were discussed.   DISCHARGE MEDICATIONS:  1.  Wellbutrin 150 mg q.a.m.  2.  Cymbalta 30 mg b.i.d.  3.  Risperdal 0.5 mg, 2-1/2 at 9 p.m.  4.  Rozerem 8 mg at 10 p.m.   FOLLOW UP:  The patient was discharged to follow up with Dr. Kathrynn Running on  Tuesday, February 28th at 3 p.m. and Carlus Pavlov within 7-14 days.   DISCHARGE DIAGNOSES:   AXIS I:  Bipolar disorder, mixed, depressed phase versus major depressive  disorder, recurrent, severe.   AXIS II:  Mixed personality features, some borderline and histrionic  features only--no disorder.   AXIS III:  Asthma.   AXIS IV:  Moderate (stressors related to limited support system, some  isolation, essentially a single mom, not doing well on medications regarding  mood and anxiety for some time now).   AXIS V:  Global Assessment of Functioning on discharge 55.      JEM/MEDQ  D:  07/11/2004  T:  07/11/2004  Job:  [id number]

## 2010-09-04 NOTE — H&P (Signed)
[facility]  Patient:    Tami Turner, Tami Turner                         MRN: [id number] Adm. Date:  [id number] Disc. Date: [id number] Attending:  Jasmine Pang CC:         Bevelyn Ngo.  Andee Poles, M.D.   Psychiatric Admission Assessment  SHORT DAY SUMMARY (COMBINED PSYCHIATRIC/DISCHARGE SUMMARY)  PATIENT IDENTIFICATION:  A 50 year old Caucasian female, patient of Dr. Wynonia Lawman and Bevelyn Ngo.  HISTORY OF PRESENT ILLNESS:  Patient has a history of depression.  She was admitted after becoming very distraught, disorganized and unable to function. She has a history of dissociative episodes periodically, according to her therapist; however, she is able to recompensate fairly quickly.  Medicines for mood stabilization include Effexor XR 225 mg p.o. q.d. and Wellbutrin 150 mg p.o. b.i.d.  She is also on alprazolam .5 mg p.o. q.6h.  PAST PSYCHIATRIC HISTORY:  As indicated above, patient is seen in therapy by Bevelyn Ngo.  She had been treated by Dr. Wynonia Lawman before he left the [facility].  She currently is looking for another psychiatrist.  SUBSTANCE ABUSE HISTORY:  Patient denies any use of substances.  PAST MEDICAL HISTORY:  Patients primary care physician is Dr. Cleta Alberts.  Patient has asthma, degenerative disk disease, sciatica/arthritis, seasonal allergies. In addition to her psychiatric medications, she is on Claritin-D daily and Vioxx 25 mg q.d.  DRUG ALLERGIES:  SULFA DRUGS.  FAMILY PSYCHIATRIC HISTORY:  Mother has a history of bipolar disorder.  Uncle has psychiatric problems.  Her sister suffers from depression.  SOCIAL HISTORY:  Patient is divorced.  She has 2 children.  She has some child support conflict with her ex-husband.  She works as a Comptroller.  MENTAL STATUS EXAMINATION:  Patient presented as a friendly, calm, neatly dressed Caucasian female with good eye contact.  There was psychomotor retardation.  Speech was  soft and slow and spontaneous.  Mood depressed and anxious.  She denied current suicidal or homicidal ideation.  There was no psychosis or perceptual disturbance.  Thought processes were logical and goal directed.  Thought content revealed no predominant theme.  On cognitive exam, patient was alert and oriented x 4.  Short term and long term memory were adequate.  General fund of knowledge were age and education level appropriate.  Attention and concentration were somewhat diminished as assessed by her distractibility.  Insight minimal, judgment poor.  ADMISSION DIAGNOSES: Axis I:    1. Major depression, recurrent severe.            2. Rule out dissociative disorder not otherwise specified. Axis II:   Deferred. Axis III:  Asthma, arthritis, degenerative disk disease, seasonal            allergies. Axis IV:   Moderate. Axis V:    Global assessment of function current 30, highest past year 70.  HOSPITAL COURSE:  Patient remained on the unit for a brief crisis stabilization.  She recompensated quickly and was functioning well by the time I met with her in the morning.  In addition, her therapist, Bevelyn Ngo, saw her and felt she was stable and could be transitioned back in to the outpatient setting.   Her medications were unchanged due to the brief stay. She will be referred to Dr. Andee Poles for follow up medication management.  DISCHARGE MENTAL STATUS EXAMINATION:  Patient was no longer regressed.  Mood was improved though  still somewhat depressed and anxious.  Affect wide range. No suicidal or homicidal ideation, no psychosis.  DISCHARGE MEDICATIONS: 1. Effexor XR 225 mg q.d. 2. Wellbutrin SR 150 mg p.o. b.i.d. 3. Alprazolam .5 p.o. q.6h.  She will also stay on Claritin and Vioxx.  DISCHARGE DIAGNOSES: Axis I:    1. Major depression, recurrent severe.            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 Axis II:   None. Axis III:  Asthma, arthritis, degenerative  disk disease, seasonal            allergies. Axis IV:   Moderate. Axis V:    Global assessment of function current 50, highest past year 70.            admit status 30.  POST HOSPITAL CARE PLAN:  Return home to live with her family.  Follow up therapy will be with [facility]  She already has an appointment for this. Follow up medication management will be with Andee Poles, MD. DD:  05/06/00 TD:  05/07/00 Job: [id number]

## 2011-01-25 LAB — BASIC METABOLIC PANEL
BUN: 10
BUN: 12
BUN: 8
CO2: 23
CO2: 25
Calcium: 9
Calcium: 9.2
Calcium: 9.4
Calcium: 9.4
Chloride: 106
Creatinine, Ser: 0.81
Creatinine, Ser: 0.88
Creatinine, Ser: 0.89
GFR calc non Af Amer: >60
GFR calc non Af Amer: >60
GFR calc non Af Amer: >60
Glucose, Bld: 107 — ABNORMAL HIGH
Glucose, Bld: 143 — ABNORMAL HIGH
Glucose, Bld: 178 — ABNORMAL HIGH
Potassium: 4
Potassium: 4
Potassium: 4
Sodium: 135
Sodium: 138
Sodium: 140

## 2011-01-25 LAB — DIFFERENTIAL
Basophils Absolute: 0
Basophils Relative: 0
Eosinophils Absolute: 0.4
Eosinophils Relative: 4
Monocytes Absolute: 0.5
Neutro Abs: 5.5

## 2011-01-25 LAB — URINALYSIS, ROUTINE W REFLEX MICROSCOPIC
Nitrite: NEGATIVE
Urobilinogen, UA: 0.2
pH: 5.5

## 2011-01-25 LAB — URINE MICROSCOPIC-ADD ON

## 2011-01-25 LAB — CBC
Hemoglobin: 13.5
MCHC: 35.4
Platelets: 292
RDW: 12.3

## 2011-01-25 LAB — POCT CARDIAC MARKERS: Operator id: 4533

## 2011-01-26 LAB — BASIC METABOLIC PANEL
BUN: 6
BUN: 7
CO2: 24
CO2: 25
Calcium: 8 — ABNORMAL LOW
Calcium: 8.1 — ABNORMAL LOW
Calcium: 8.3 — ABNORMAL LOW
Calcium: 8.6
Chloride: 107
Chloride: 113 — ABNORMAL HIGH
Creatinine, Ser: 0.71
GFR calc Af Amer: >60
GFR calc Af Amer: >60
GFR calc Af Amer: >60
GFR calc Af Amer: >60
GFR calc non Af Amer: >60
GFR calc non Af Amer: >60
GFR calc non Af Amer: >60
Glucose, Bld: 116 — ABNORMAL HIGH
Potassium: 3.7
Potassium: 3.8
Potassium: 3.8
Sodium: 135
Sodium: 140
Sodium: 142
Sodium: 143

## 2011-01-26 LAB — COMPREHENSIVE METABOLIC PANEL
AST: 19
Albumin: <1 — ABNORMAL LOW
Alkaline Phosphatase: 39
CO2: 25
Chloride: 109
GFR calc Af Amer: >60
GFR calc non Af Amer: >60
Glucose, Bld: 119 — ABNORMAL HIGH
Potassium: 3.1 — ABNORMAL LOW
Total Protein: 5.6 — ABNORMAL LOW

## 2011-01-26 LAB — DIFFERENTIAL
Monocytes Relative: 6
Neutro Abs: 6.4
Neutrophils Relative %: 70

## 2011-01-26 LAB — TRICYCLICS SCREEN, URINE: TCA Scrn: POSITIVE — AB

## 2011-01-26 LAB — POCT I-STAT 3, ART BLOOD GAS (G3+)
Operator id: 135691
Patient temperature: 98
pH, Arterial: 7.378

## 2011-01-26 LAB — LACTIC ACID, PLASMA: Lactic Acid, Venous: 1.8

## 2011-01-26 LAB — RAPID URINE DRUG SCREEN, HOSP PERFORMED
Amphetamines: NOT DETECTED
Barbiturates: NOT DETECTED
Tetrahydrocannabinol: NOT DETECTED

## 2011-01-26 LAB — MAGNESIUM: Magnesium: 2.4

## 2011-01-26 LAB — CBC
HCT: 37.4
Hemoglobin: 13
MCHC: 34.6

## 2011-01-26 LAB — PHOSPHORUS: Phosphorus: 3.9

## 2014-11-14 ENCOUNTER — Other Ambulatory Visit: Payer: Self-pay | Admitting: Internal Medicine

## 2014-11-14 DIAGNOSIS — Z1231 Encounter for screening mammogram for malignant neoplasm of breast: Secondary | ICD-10-CM

## 2014-12-05 ENCOUNTER — Ambulatory Visit
Admission: RE | Admit: 2014-12-05 | Discharge: 2014-12-05 | Disposition: A | Discharge status: Home / Self Care | Payer: Medicare PPO | Source: Ambulatory Visit | Attending: Internal Medicine | Admitting: Internal Medicine

## 2014-12-05 DIAGNOSIS — Z1231 Encounter for screening mammogram for malignant neoplasm of breast: Secondary | ICD-10-CM

## 2015-05-28 ENCOUNTER — Telehealth (HOSPITAL_COMMUNITY): Payer: Self-pay | Admitting: Dentistry

## 2015-05-28 NOTE — Telephone Encounter (Signed)
05/28/2015  Patient:            Tami Turner Date of Birth:  05-05-1960 MRN:                [id number]   Patient was referred for dental consultation by Dr. Lucilla Lame at [facility]. The patient was contacted by phone and indicated that she is currently under the care of her primary dentist, Dr. Derrek Gu. The patient will continue to follow up with Dr. Orvil Feil for her comprehensive dental treatment needs.  Lattie Haw Ingram/Dr. Enrique Sack

## 2015-06-20 ENCOUNTER — Other Ambulatory Visit: Payer: Self-pay | Admitting: Endocrinology

## 2015-06-20 DIAGNOSIS — E041 Nontoxic single thyroid nodule: Secondary | ICD-10-CM

## 2015-06-24 ENCOUNTER — Ambulatory Visit
Admission: RE | Admit: 2015-06-24 | Discharge: 2015-06-24 | Disposition: A | Discharge status: Home / Self Care | Payer: Medicare Other | Source: Ambulatory Visit | Attending: Endocrinology | Admitting: Endocrinology

## 2015-06-24 DIAGNOSIS — E041 Nontoxic single thyroid nodule: Secondary | ICD-10-CM

## 2015-06-27 ENCOUNTER — Other Ambulatory Visit: Payer: Self-pay | Admitting: Internal Medicine

## 2015-06-27 DIAGNOSIS — E041 Nontoxic single thyroid nodule: Secondary | ICD-10-CM

## 2015-07-02 ENCOUNTER — Other Ambulatory Visit (HOSPITAL_COMMUNITY)
Admission: RE | Admit: 2015-07-02 | Discharge: 2015-07-02 | Disposition: A | Discharge status: Home / Self Care | Payer: Medicare Other | Source: Ambulatory Visit | Attending: General Surgery | Admitting: General Surgery

## 2015-07-02 ENCOUNTER — Ambulatory Visit
Admission: RE | Admit: 2015-07-02 | Discharge: 2015-07-02 | Disposition: A | Discharge status: Home / Self Care | Payer: Medicare Other | Source: Ambulatory Visit | Attending: Internal Medicine | Admitting: Internal Medicine

## 2015-07-02 DIAGNOSIS — E041 Nontoxic single thyroid nodule: Secondary | ICD-10-CM | POA: Diagnosis present

## 2015-08-04 ENCOUNTER — Ambulatory Visit (INDEPENDENT_AMBULATORY_CARE_PROVIDER_SITE_OTHER): Payer: Medicare Other | Admitting: Neurology

## 2015-08-04 ENCOUNTER — Encounter: Payer: Self-pay | Admitting: Neurology

## 2015-08-04 VITALS — BP 118/80 | HR 86 | Resp 20 | Ht 67.5 in | Wt 214.0 lb

## 2015-08-04 DIAGNOSIS — F17210 Nicotine dependence, cigarettes, uncomplicated: Secondary | ICD-10-CM | POA: Insufficient documentation

## 2015-08-04 DIAGNOSIS — F489 Nonpsychotic mental disorder, unspecified: Secondary | ICD-10-CM | POA: Diagnosis not present

## 2015-08-04 DIAGNOSIS — G4720 Circadian rhythm sleep disorder, unspecified type: Secondary | ICD-10-CM | POA: Diagnosis not present

## 2015-08-04 DIAGNOSIS — F5105 Insomnia due to other mental disorder: Secondary | ICD-10-CM | POA: Diagnosis not present

## 2015-08-04 NOTE — Progress Notes (Signed)
SLEEP MEDICINE CLINIC   Provider:  Larey Seat, M D  Referring Provider: Jani Gravel, MD Primary Care Physician:  Jani Gravel, MD  Chief Complaint  Patient presents with  . New Patient (Initial Visit)    had sleep study in 2009 and 2011, has mixed central apnea, rm 11, alone    HPI:  Tami Turner is a 55 y.o. female , seen here as a referral from Dr. Jani Gravel for evaluation of  sleep apnea , The patient has had a sleep study at the local hospital and one in [facility],  And reports having ' fibromyalgia " and "chronic fatigue" , being disabled since 2009 and sees  '[name]"  in [place] following her for a brain tumour.  8 years ago her sleep study took place in [place] and 6 years ago in [place].  Dr. Maudie Mercury asked for a reevaluation of apnea as a possible cause for her fatigue. She reports him times having insomnia and being able to stay awake for 3 days straight, then feeling exhausted after that. And becoming sleepy this daytime sleepiness. According to the patient her problem is insomnia with a cyclic pattern related to insomnia with hypersomnia changes and she believes previous sleep studies have.documented central and mixed sleep apnea. Her review of systems includes  Depression, weight gain, fatigue, coughing, irritable bowel syndrome with alternating diarrhea and constipation, hearing loss, difficulties with swallowing, urination difficulties with incontinence skin sensitivities,  Memory loss, confusion, headache, numbness, weakness, depression, anxiety, too much sleep, not enough sleep, restless legs,. She is status post breast reduction surgery, post tonsillectomy, post tubal ligation, post hysterectomy, and was diagnosed with a brainstem glioma.  I reviewed the patient's medication list- she is on multiple  psychotropic medications, including clonazepam, alprazolam, sertraline, sleep related she is also on Flonase, Advair Diskus and Ventolin inhaler.   Sleep habits  are as follows: Dear dr. Maudie Mercury, There are no existent routines and rules- no sleep hygiene. She is speaking with a pressured speech, I cannot inject any questions.  "I go to sleep when my body tells me to go to sleep". She also reports that she has tried to go to bed at 9 PM but this made it worse because she felt unable to go to sleep, but by the insomnia in bed, getting frustrated. She cannot answer how many hours she may sleep, she functions in cycles of hyperactivity alternating with inactivity.  She estimated only 3 hours of sleep at night, and 2 daytime sleep periods of 2-3 hours each .  Sleep medical history and family sleep history:  Mother is bipolar-  Hd breast cancer, CMT neuropathy. Father had  Diabetes, strokes, cancer. Social history:  Single , divorced, 2 sons who keep no contact with her since her first nervous breakdown, 39, she attempted suicide. Active tobacco user. No ETOH use, caffeine ; rarely.   Review of Systems: Out of a complete 14 system review, the patient complains of only the following symptoms, and all other reviewed systems are negative. See above   Epworth score  10 , Fatigue severity score 63  , see depression score: the patient made many annotations to the questionnaire that can be diagnostic in itself.   Social History   Social History  . Marital Status: Single    Spouse Name: N/A  . Number of Children: N/A  . Years of Education: N/A   Occupational History  . Not on file.   Social History Main  Topics  . Smoking status: Current Every Day Smoker -- 1.00 packs/day for 40 years    Types: Cigarettes  . Smokeless tobacco: Not on file  . Alcohol Use: No  . Drug Use: No  . Sexual Activity: Not on file   Other Topics Concern  . Not on file   Social History Narrative   Drinks rare caffeine.    Family History  Problem Relation Age of Onset  . Bipolar disorder Mother   . Breast cancer Mother   . Diabetes Father   . Stroke Father   . Cancer Father    . Arthritis Father     Past Medical History  Diagnosis Date  . Diabetes mellitus without complication ([place])   . Hypertension   . DDD (degenerative disc disease), cervical   . Depression   . Anxiety   . Glioma ([place])     tectal plate, stable  . Fibromyalgia   . Chronic pain   . Chronic fatigue   . Arthritis   . RLS (restless legs syndrome)   . Central sleep apnea   . Neuropathy ([place])   . IBS (irritable bowel syndrome)   . Dry mouth   . Asthma   . History of stomach ulcers   . Plantar fasciitis   . Fatty liver     Past Surgical History  Procedure Laterality Date  . Breast reduction surgery    . Tonsillectomy    . Abdominal hysterectomy    . Tubal ligation    . Biopsy thyroid      Current Outpatient Prescriptions  Medication Sig Dispense Refill  . albuterol (PROVENTIL HFA;VENTOLIN HFA) 108 (90 Base) MCG/ACT inhaler Inhale into the lungs every 6 (six) hours as needed for wheezing or shortness of breath.    . ALPRAZolam (XANAX) 1 MG tablet Take 1 mg by mouth 2 (two) times daily.    [place] Kitchen aspirin 81 MG tablet Take 81 mg by mouth daily.    . carvedilol (COREG) 25 MG tablet Take 25 mg by mouth 2 (two) times daily with a meal.    . Cholecalciferol (VITAMIN D3) 2000 units TABS Take by mouth.    . clonazePAM (KLONOPIN) 2 MG tablet Take 2 mg by mouth 2 (two) times daily.    . cyclobenzaprine (FLEXERIL) 10 MG tablet Take 10 mg by mouth.    . fluconazole (DIFLUCAN) 150 MG tablet Take 150 mg by mouth daily.    . fluticasone (FLONASE) 50 MCG/ACT nasal spray Place into both nostrils daily.    . Fluticasone-Salmeterol (ADVAIR) 250-50 MCG/DOSE AEPB Inhale 1 puff into the lungs 2 (two) times daily.    [place] Kitchen glimepiride (AMARYL) 2 MG tablet Take 2 mg by mouth daily with breakfast.    . l-methylfolate-B6-B12 (METANX) 3-35-2 MG TABS tablet Take 1 tablet by mouth daily.    [place] Kitchen linaclotide (LINZESS) 145 MCG CAPS capsule Take 145 mcg by mouth daily before breakfast.    . Magnesium 250 MG TABS Take  1,000 mg by mouth.    . metFORMIN (GLUCOPHAGE) 1000 MG tablet 1,000 mg.    . milk thistle 175 MG tablet Take 175 mg by mouth daily.    [place] Kitchen omeprazole (PRILOSEC) 20 MG capsule Take 20 mg by mouth daily.    . pregabalin (LYRICA) 50 MG capsule Take 50 mg by mouth.    . Red Yeast Rice Extract (RED YEAST RICE PO) Take by mouth.    . sertraline (ZOLOFT) 100 MG tablet Take 100 mg by mouth daily.    Mar[place] Kitchen  TURMERIC PO Take by mouth.    . venlafaxine XR (EFFEXOR-XR) 75 MG 24 hr capsule Take 225 mg by mouth. Take 3 capsules once daily    . vitamin B-12 (CYANOCOBALAMIN) 100 MCG tablet Take 100 mcg by mouth daily.     No current facility-administered medications for this visit.    Allergies as of 08/04/2015 - Review Complete 08/04/2015  Allergen Reaction Noted  . Topiramate Other (See Comments) 08/04/2015  . Amitriptyline  08/04/2015  . Exenatide Other (See Comments) 08/04/2015  . Gabapentin  08/04/2015  . Oxycodone-acetaminophen  08/04/2015  . Trulicity [dulaglutide]  08/04/2015  . Valproic acid  08/04/2015  . Sulfa antibiotics Nausea And Vomiting 08/04/2015    Vitals: BP 118/80 mmHg  Pulse 86  Resp 20  Ht 5' 7.5" (1.715 m)  Wt 214 lb (97.07 kg)  BMI 33.00 kg/m2 Last Weight:  Wt Readings from Last 1 Encounters:  08/04/15 214 lb (97.07 kg)   [id number] mass index is 33 kg/(m^2).     Last Height:   Ht Readings from Last 1 Encounters:  08/04/15 5' 7.5" (1.715 m)    Physical exam:  General: The patient is awake, alert and appears not in acute distress. The patient is dramatic, pressured speech, underpinned by many gestures.  Head: Normocephalic, atraumatic. Neck is supple. Mallampati 1,  neck circumference: 15 inches  Nasal airflow unrestricted , TMJ is  Not  evident . Retrognathia is not seen. Natural teeth.  Cardiovascular:  Regular rate and rhythm , without  murmurs or carotid bruit, and without distended neck veins. Respiratory: Lungs are clear to auscultation. Skin:  Without evidence of  edema, or rash Trunk: BMI is elevated  Neurologic exam : The patient is awake and alert, oriented to place and time.   Memory subjectivedescribed as intact. I will not test this here.  Attention span & concentration ability appears normal.  Speech is logorrheic, pressured, fluent,  without dysarthria, dysphonia or aphasia.  Mood and affect are dramatic.  Cranial nerves: Pupils are equal and briskly reactive to light.  Extraocular movements  in vertical and horizontal planes intact and without nystagmus. Visual fields by finger perimetry are intact. Hearing to finger rub intact. Facial sensation intact to fine touch.Facial motor strength is symmetric and tongue and uvula move midline. Shoulder shrug was symmetrical.   Motor exam:   Normal tone, muscle bulk and symmetric strength in all extremities.  Dear Dr Maudie Mercury, I will offer this multi-facetted patient a HST , if negative for apnea I will not follow up and implore you to arrange for psychiatric follow up.  The patient has no routines, rules, or rituals helping her with sleep induction. 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    The patient was advised of the nature of the diagnosed sleep disorder , the treatment options and risks for general a health and wellness arising from not treating the condition.  I spent more than 50 minutes of face to face time with the patient. Greater than 50% of time was spent in counseling and coordination of care. We have discussed the diagnosis and differential and I answered the patient's questions.     Assessment:  After physical and neurologic examination, review of laboratory studies,  Personal review of imaging studies, reports of other /same  Imaging studies ,  Results of polysomnography/ neurophysiology  testing and pre-existing records as far as provided in visit., my assessment is    1) Depression, cyclic . Status post ECT , on medications. Still Anxiety is a main problem and she feels this causes Insomnia. I will not follow non- organic causes of insomnia.   2) Apnea- no access to previous studies, as she doesn't recall where and when in [place] she was tested. I am willing to screen her for apnea, prefer a HST.   3) no follow up if no significant Apnea is found.   Plan:  Treatment plan and additional workup :  HST - Follow up only if positive.  This patient presents with many signs and symptoms of non organic sleep disorders and a history of mental illness.   Please remember to try to maintain good sleep hygiene, which means: Keep a regular sleep and wake schedule, try not to exercise or have a meal within 2 hours of your bedtime, try to keep your bedroom conducive for sleep, that is, cool and dark, without light distractors such as an illuminated alarm clock, and refrain from watching TV right before sleep or in the middle of the night and do not keep the TV or radio on during the night. Also, try not to use or play on electronic devices at bedtime, such as your cell phone, tablet PC or laptop. If you like to read at bedtime on an electronic device, try to dim the background light as much as possible. Do not eat in the middle of the night.   We will request a sleep study.    We will look for leg twitching and snoring or sleep apnea.   For chronic insomnia, you need to be  followed by a psychiatrist and/or sleep psychologist.   We will call you with the sleep study results and make a follow up appointment if needed.       Asencion Partridge Jiselle Sheu MD  08/04/2015   CC: Jani Gravel, Md [address] [place], [place] [address]

## 2015-08-04 NOTE — Patient Instructions (Addendum)
Smoking Cessation, Tips for Success If you are ready to quit smoking, congratulations! You have chosen to help yourself be healthier. Cigarettes bring nicotine, tar, carbon monoxide, and other irritants into your body. Your lungs, heart, and blood vessels will be able to work better without these poisons. There are many different ways to quit smoking. Nicotine gum, nicotine patches, a nicotine inhaler, or nicotine nasal spray can help with physical craving. Hypnosis, support groups, and medicines help break the habit of smoking. WHAT THINGS CAN I DO TO MAKE QUITTING EASIER?  Here are some tips to help you quit for good:  Pick a date when you will quit smoking completely. Tell all of your friends and family about your plan to quit on that date.  Do not try to slowly cut down on the number of cigarettes you are smoking. Pick a quit date and quit smoking completely starting on that day.  Throw away all cigarettes.   Clean and remove all ashtrays from your home, work, and car.  On a card, write down your reasons for quitting. Carry the card with you and read it when you get the urge to smoke.  Cleanse your body of nicotine. Drink enough water and fluids to keep your urine clear or pale yellow. Do this after quitting to flush the nicotine from your body.  Learn to predict your moods. Do not let a bad situation be your excuse to have a cigarette. Some situations in your life might tempt you into wanting a cigarette.  Never have "just one" cigarette. It leads to wanting another and another. Remind yourself of your decision to quit.  Change habits associated with smoking. If you smoked while driving or when feeling stressed, try other activities to replace smoking. Stand up when drinking your coffee. Brush your teeth after eating. Sit in a different chair when you read the paper. Avoid alcohol while trying to quit, and try to drink fewer caffeinated beverages. Alcohol and caffeine may urge you to  smoke.  Avoid foods and drinks that can trigger a desire to smoke, such as sugary or spicy foods and alcohol.  Ask people who smoke not to smoke around you.  Have something planned to do right after eating or having a cup of coffee. For example, plan to take a walk or exercise.  Try a relaxation exercise to calm you down and decrease your stress. Remember, you may be tense and nervous for the first 2 weeks after you quit, but this will pass.  Find new activities to keep your hands busy. Play with a pen, coin, or rubber band. Doodle or draw things on paper.  Brush your teeth right after eating. This will help cut down on the craving for the taste of tobacco after meals. You can also try mouthwash.   Use oral substitutes in place of cigarettes. Try using lemon drops, carrots, cinnamon sticks, or chewing gum. Keep them handy so they are available when you have the urge to smoke.  When you have the urge to smoke, try deep breathing.  Designate your home as a nonsmoking area.  If you are a heavy smoker, ask your health care provider about a prescription for nicotine chewing gum. It can ease your withdrawal from nicotine.  Reward yourself. Set aside the cigarette money you save and buy yourself something nice.  Look for support from others. Join a support group or smoking cessation program. Ask someone at home or at work to help you with your plan   to quit smoking.  Always ask yourself, "Do I need this cigarette or is this just a reflex?" Tell yourself, "Today, I choose not to smoke," or "I do not want to smoke." You are reminding yourself of your decision to quit.  Do not replace cigarette smoking with electronic cigarettes (commonly called e-cigarettes). The safety of e-cigarettes is unknown, and some may contain harmful chemicals.  If you relapse, do not give up! Plan ahead and think about what you will do the next time you get the urge to smoke. HOW WILL I FEEL WHEN I QUIT SMOKING? You  may have symptoms of withdrawal because your body is used to nicotine (the addictive substance in cigarettes). You may crave cigarettes, be irritable, feel very hungry, cough often, get headaches, or have difficulty concentrating. The withdrawal symptoms are only temporary. They are strongest when you first quit but will go away within 10-14 days. When withdrawal symptoms occur, stay in control. Think about your reasons for quitting. Remind yourself that these are signs that your body is healing and getting used to being without cigarettes. Remember that withdrawal symptoms are easier to treat than the major diseases that smoking can cause.  Even after the withdrawal is over, expect periodic urges to smoke. However, these cravings are generally short lived and will go away whether you smoke or not. Do not smoke! WHAT RESOURCES ARE AVAILABLE TO HELP ME QUIT SMOKING? Your health care provider can direct you to community resources or hospitals for support, which may include:  Group support.  Education.  Hypnosis.  Therapy.   This information is not intended to replace advice given to you by your health care provider. Make sure you discuss any questions you have with your health care provider.   Document Released: 01/02/2004 Document Revised: 04/26/2014 Document Reviewed: 09/21/2012  Please remember to try to maintain good sleep hygiene, which means: Keep a regular sleep and wake schedule, try not to exercise or have a meal within 2 hours of your bedtime, try to keep your bedroom conducive for sleep, that is, cool and dark, without light distractors such as an illuminated alarm clock, and refrain from watching TV right before sleep or in the middle of the night and do not keep the TV or radio on during the night. Also, try not to use or play on electronic devices at bedtime, such as your cell phone, tablet PC or laptop. If you like to read at bedtime on an electronic device, try to dim the background  light as much as possible. Do not eat in the middle of the night.   We will request a sleep study.    We will look for leg twitching and snoring or sleep apnea.   For chronic insomnia, you are best followed by a psychiatrist and/or sleep psychologist.   We will call you with the sleep study results and make a follow up appointment if needed.    Elsevier Interactive Patient Education [employer]. Insomnia Insomnia is a sleep disorder that makes it difficult to fall asleep or to stay asleep. Insomnia can cause tiredness (fatigue), low energy, difficulty concentrating, mood swings, and poor performance at work or school.  There are three different ways to classify insomnia:  Difficulty falling asleep.  Difficulty staying asleep.  Waking up too early in the morning. Any type of insomnia can be long-term (chronic) or short-term (acute). Both are common. Short-term insomnia usually lasts for three months or less. Chronic insomnia occurs at least three  times a week for longer than three months. CAUSES  Insomnia may be caused by another condition, situation, or substance, such as:  Anxiety.  Certain medicines.  Gastroesophageal reflux disease (GERD) or other gastrointestinal conditions.  Asthma or other breathing conditions.  Restless legs syndrome, sleep apnea, or other sleep disorders.  Chronic pain.  Menopause. This may include hot flashes.  Stroke.  Abuse of alcohol, tobacco, or illegal drugs.  Depression.  Caffeine.   Neurological disorders, such as Alzheimer disease.  An overactive thyroid (hyperthyroidism). The cause of insomnia may not be known. RISK FACTORS Risk factors for insomnia include:  Gender. Women are more commonly affected than men.  Age. Insomnia is more common as you get older.  Stress. This may involve your professional or personal life.  Income. Insomnia is more common in people with lower income.  Lack of exercise.   Irregular  work schedule or night shifts.  Traveling between different time zones. SIGNS AND SYMPTOMS If you have insomnia, trouble falling asleep or trouble staying asleep is the main symptom. This may lead to other symptoms, such as:  Feeling fatigued.  Feeling nervous about going to sleep.  Not feeling rested in the morning.  Having trouble concentrating.  Feeling irritable, anxious, or depressed. TREATMENT  Treatment for insomnia depends on the cause. If your insomnia is caused by an underlying condition, treatment will focus on addressing the condition. Treatment may also include:   Medicines to help you sleep.  Counseling or therapy.  Lifestyle adjustments. HOME CARE INSTRUCTIONS   Take medicines only as directed by your health care provider.  Keep regular sleeping and waking hours. Avoid naps.  Keep a sleep diary to help you and your health care provider figure out what could be causing your insomnia. Include:   When you sleep.  When you wake up during the night.  How well you sleep.   How rested you feel the next day.  Any side effects of medicines you are taking.  What you eat and drink.   Make your bedroom a comfortable place where it is easy to fall asleep:  Put up shades or special blackout curtains to block light from outside.  Use a white noise machine to block noise.  Keep the temperature cool.   Exercise regularly as directed by your health care provider. Avoid exercising right before bedtime.  Use relaxation techniques to manage stress. Ask your health care provider to suggest some techniques that may work well for you. These may include:  Breathing exercises.  Routines to release muscle tension.  Visualizing peaceful scenes.  Cut back on alcohol, caffeinated beverages, and cigarettes, especially close to bedtime. These can disrupt your sleep.  Do not overeat or eat spicy foods right before bedtime. This can lead to digestive discomfort that  can make it hard for you to sleep.  Limit screen use before bedtime. This includes:  Watching TV.  Using your smartphone, tablet, and computer.  Stick to a routine. This can help you fall asleep faster. Try to do a quiet activity, brush your teeth, and go to bed at the same time each night.  Get out of bed if you are still awake after 15 minutes of trying to sleep. Keep the lights down, but try reading or doing a quiet activity. When you feel sleepy, go back to bed.  Make sure that you drive carefully. Avoid driving if you feel very sleepy.  Keep all follow-up appointments as directed by your health care provider.  This is important. SEEK MEDICAL CARE IF:   You are tired throughout the day or have trouble in your daily routine due to sleepiness.  You continue to have sleep problems or your sleep problems get worse. SEEK IMMEDIATE MEDICAL CARE IF:   You have serious thoughts about hurting yourself or someone else.   This information is not intended to replace advice given to you by your health care provider. Make sure you discuss any questions you have with your health care provider.   Document Released: 04/02/2000 Document Revised: 12/25/2014 Document Reviewed: 01/04/2014 Elsevier Interactive Patient Education [employer].

## 2015-08-05 ENCOUNTER — Telehealth: Payer: Self-pay

## 2015-08-05 NOTE — Telephone Encounter (Signed)
I returned pt's call. 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 Brett Fairy to review the MRIs. I advised her that she is welcome to drop off the MRIs at the office and Dr. Brett Fairy can look at them and then have them scanned into the EMR, or she can bring them to the follow up appt with Dr. Brett Fairy after the sleep study.  Pt states she is concerned about having a sleep study because she sleeps more during the day than she does at night, and what if she does not get any sleep during the night of the sleep study? I advised her that I would send this message to our sleep lab manager to discuss. Pt verbalized understanding.

## 2015-08-05 NOTE — Telephone Encounter (Signed)
Tami Turner, this patient called, had difficulty understanding the purpose of her call.  Said something about sending mri results to Dr. Brett Fairy.  I told her that I would have you call her.  Best number to reach her is [phone]

## 2015-08-07 ENCOUNTER — Telehealth: Payer: Self-pay

## 2015-08-07 NOTE — Telephone Encounter (Signed)
Left patient 2 messages to call me regarding her questions about sleep study. Have not heard back.

## 2016-07-28 ENCOUNTER — Other Ambulatory Visit: Payer: Self-pay | Admitting: Internal Medicine

## 2016-07-28 DIAGNOSIS — R945 Abnormal results of liver function studies: Secondary | ICD-10-CM

## 2016-08-06 ENCOUNTER — Ambulatory Visit
Admission: RE | Admit: 2016-08-06 | Discharge: 2016-08-06 | Disposition: A | Discharge status: Home / Self Care | Payer: Medicare Other | Source: Ambulatory Visit | Attending: Internal Medicine | Admitting: Internal Medicine

## 2016-08-06 DIAGNOSIS — R945 Abnormal results of liver function studies: Secondary | ICD-10-CM

## 2017-08-09 ENCOUNTER — Other Ambulatory Visit: Payer: Self-pay | Admitting: Endocrinology

## 2017-08-09 DIAGNOSIS — E041 Nontoxic single thyroid nodule: Secondary | ICD-10-CM

## 2017-09-19 ENCOUNTER — Ambulatory Visit
Admission: RE | Admit: 2017-09-19 | Discharge: 2017-09-19 | Disposition: A | Discharge status: Home / Self Care | Payer: Medicare Other | Source: Ambulatory Visit | Attending: Endocrinology | Admitting: Endocrinology

## 2017-09-19 DIAGNOSIS — E041 Nontoxic single thyroid nodule: Secondary | ICD-10-CM

## 2018-01-04 ENCOUNTER — Other Ambulatory Visit: Payer: Self-pay | Admitting: Endocrinology

## 2018-01-04 DIAGNOSIS — E041 Nontoxic single thyroid nodule: Secondary | ICD-10-CM

## 2018-01-14 ENCOUNTER — Emergency Department (HOSPITAL_COMMUNITY)
Admission: EM | Admit: 2018-01-14 | Discharge: 2018-01-14 | Disposition: A | Discharge status: Home / Self Care | Payer: Medicare Other | Attending: Emergency Medicine | Admitting: Emergency Medicine

## 2018-01-14 ENCOUNTER — Encounter (HOSPITAL_COMMUNITY): Payer: Self-pay

## 2018-01-14 ENCOUNTER — Emergency Department (HOSPITAL_COMMUNITY): Payer: Medicare Other

## 2018-01-14 ENCOUNTER — Other Ambulatory Visit: Payer: Self-pay

## 2018-01-14 DIAGNOSIS — Y998 Other external cause status: Secondary | ICD-10-CM | POA: Diagnosis not present

## 2018-01-14 DIAGNOSIS — F17210 Nicotine dependence, cigarettes, uncomplicated: Secondary | ICD-10-CM | POA: Insufficient documentation

## 2018-01-14 DIAGNOSIS — E1140 Type 2 diabetes mellitus with diabetic neuropathy, unspecified: Secondary | ICD-10-CM | POA: Insufficient documentation

## 2018-01-14 DIAGNOSIS — J45909 Unspecified asthma, uncomplicated: Secondary | ICD-10-CM | POA: Diagnosis not present

## 2018-01-14 DIAGNOSIS — R911 Solitary pulmonary nodule: Secondary | ICD-10-CM | POA: Diagnosis not present

## 2018-01-14 DIAGNOSIS — Y9301 Activity, walking, marching and hiking: Secondary | ICD-10-CM | POA: Insufficient documentation

## 2018-01-14 DIAGNOSIS — S2232XA Fracture of one rib, left side, initial encounter for closed fracture: Secondary | ICD-10-CM | POA: Insufficient documentation

## 2018-01-14 DIAGNOSIS — Y92018 Other place in single-family (private) house as the place of occurrence of the external cause: Secondary | ICD-10-CM | POA: Insufficient documentation

## 2018-01-14 DIAGNOSIS — W01190A Fall on same level from slipping, tripping and stumbling with subsequent striking against furniture, initial encounter: Secondary | ICD-10-CM | POA: Diagnosis not present

## 2018-01-14 DIAGNOSIS — Z79899 Other long term (current) drug therapy: Secondary | ICD-10-CM | POA: Diagnosis not present

## 2018-01-14 DIAGNOSIS — Z7984 Long term (current) use of oral hypoglycemic drugs: Secondary | ICD-10-CM | POA: Insufficient documentation

## 2018-01-14 DIAGNOSIS — R0789 Other chest pain: Secondary | ICD-10-CM | POA: Diagnosis not present

## 2018-01-14 DIAGNOSIS — S299XXA Unspecified injury of thorax, initial encounter: Secondary | ICD-10-CM | POA: Diagnosis present

## 2018-01-14 DIAGNOSIS — I10 Essential (primary) hypertension: Secondary | ICD-10-CM | POA: Insufficient documentation

## 2018-01-14 DIAGNOSIS — Z7982 Long term (current) use of aspirin: Secondary | ICD-10-CM | POA: Diagnosis not present

## 2018-01-14 LAB — I-STAT CHEM 8, ED
BUN: 12 mg/dL (ref 6–20)
CALCIUM ION: 1.19 mmol/L (ref 1.15–1.40)
CREATININE: 0.5 mg/dL (ref 0.44–1.00)
Chloride: 104 mmol/L (ref 98–111)
Glucose, Bld: 120 mg/dL — ABNORMAL HIGH (ref 70–99)
HEMATOCRIT: 44 % (ref 36.0–46.0)
HEMOGLOBIN: 15 g/dL (ref 12.0–15.0)
Potassium: 4.2 mmol/L (ref 3.5–5.1)
Sodium: 139 mmol/L (ref 135–145)
TCO2: 27 mmol/L (ref 22–32)

## 2018-01-14 MED ORDER — IOHEXOL 300 MG/ML  SOLN
75.0000 mL | Freq: Once | INTRAMUSCULAR | Status: AC | PRN
  Administered 2018-01-14: 100 mL via INTRAVENOUS

## 2018-01-14 MED ORDER — METHOCARBAMOL 500 MG PO TABS
500.0000 mg | ORAL_TABLET | Freq: Once | ORAL | Status: DC
  Filled 2018-01-14: qty 1

## 2018-01-14 MED ORDER — MORPHINE SULFATE (PF) 4 MG/ML IV SOLN
4.0000 mg | Freq: Once | INTRAVENOUS | Status: AC
  Administered 2018-01-14: 4 mg via INTRAVENOUS
  Filled 2018-01-14: qty 1

## 2018-01-14 MED ORDER — LIDOCAINE 5 % EX PTCH: 1.0000 | MEDICATED_PATCH | CUTANEOUS | 0 refills | Status: AC

## 2018-01-14 MED ORDER — OXYCODONE HCL 5 MG PO TABS: 5.0000 mg | ORAL_TABLET | Freq: Four times a day (QID) | ORAL | 0 refills | Status: DC | PRN

## 2018-01-14 MED ORDER — LIDOCAINE 5 % EX PTCH
1.0000 | MEDICATED_PATCH | CUTANEOUS | Status: DC
  Administered 2018-01-14: 1 via TRANSDERMAL
  Filled 2018-01-14: qty 1

## 2018-01-14 MED ORDER — HYDROCODONE-ACETAMINOPHEN 5-325 MG PO TABS
1.0000 | ORAL_TABLET | Freq: Once | ORAL | Status: DC
  Filled 2018-01-14: qty 1

## 2018-01-14 MED ORDER — OXYCODONE HCL 5 MG PO TABS
10.0000 mg | ORAL_TABLET | Freq: Once | ORAL | Status: AC
  Administered 2018-01-14: 10 mg via ORAL
  Filled 2018-01-14: qty 2

## 2018-01-14 NOTE — Discharge Instructions (Addendum)
You have a nondisplaced fracture to your fifth rib that is likely causing most of your pain you also have some soft tissue injury and contusion to the chest wall and breast but no evidence of severe injury to the breast tissue.  Please take oxycodone 1 to 2 tablets every 6 hours as needed for severe pain.  You can also use lidocaine patches, and alternate ice and heat over the area, try and wear a supportive bra to help with breast discomfort.  The walker you have at home to help give more support while you are up walking around, and make sure you are going short distances and not overdoing it.  Use incentive spirometry 5 times every hour to help encourage deep breathing and prevent pneumonia.  Please follow-up with your primary care doctor early next week for recheck to make sure your symptoms are improving. Follow up regarding lung nodule.  Return for significantly worsening chest wall pain, shortness of breath or difficulty breathing, fevers, productive cough or any other new or concerning symptoms.

## 2018-01-14 NOTE — ED Notes (Signed)
Patient was unable to ambulate.

## 2018-01-14 NOTE — ED Notes (Signed)
X-RAY STAFF INFORMED EMT THAT PATIENT HAD A NEAR SYNCOPE EVENT IN X-RAY SO AND EKG WAS DONE.

## 2018-01-14 NOTE — ED Triage Notes (Signed)
She states she fell at her home two days ago. She states she landed on her couch, landing on her left axilla area. She is here today with c/o persistent and worsening pain at left axilla/lat. Breast and neck/shoulder area. She rec'd. 100 mcg of Fentanyl IV and 4mg  of Zofran IV en route to hospital. She is in no distress.

## 2018-01-14 NOTE — ED Notes (Signed)
Bed: TL57 Expected date:  Expected time:  Means of arrival:  Comments: 57 yo fall, wrist pain

## 2018-01-14 NOTE — ED Provider Notes (Signed)
[facility] DEPT Provider Note   CSN: [id number] Arrival date & time: 01/14/18  1141     History   Chief Complaint Chief Complaint  Patient presents with  . Fall    HPI Tami Turner is a 57 y.o. female.  Tami Turner is a 57 y.o. Female with a history of fibromyalgia, chronic pain and chronic fatigue, degenerative disc disease, diabetes, IBS, hypertension, RLS and anxiety, who presents to the emergency department for evaluation after a fall.  Patient reports 2 days ago she tripped and fell, she reports she fell onto the arm of her couch landing on her left flank and left axilla.  She reports the pain seemed mild over the past 2 days but has gradually been getting worse and this morning was very severe.  Pain primarily over the left lateral ribs and under the left breast.  She also reports chronic neck and shoulder pain due to cervical disc disease that she feels is also contributing to her pain.  Pain is worse with movement and deep breath.  She has not noticed any bruising, redness or wounds.  No abdominal pain.  No pain over the back or shoulder.  Patient received 100 mcg of fentanyl and 4 of Zofran with EMS while en route to hospital. She has been taking her regular medications for fibromyalgia at home but has not tried anything else to treat this acute pain prior to arrival, is not currently on any chronic opioids.  She reports she did not hit her head with this fall and did not have any loss of consciousness.  No new numbness or weakness, no loss of bowel or bladder control.  No low back pain.     Past Medical History:  Diagnosis Date  . Anxiety   . Arthritis   . Asthma   . Central sleep apnea   . Chronic fatigue   . Chronic pain   . DDD (degenerative disc disease), cervical   . Depression   . Diabetes mellitus without complication ([place])   . Dry mouth   . Fatty liver   . Fibromyalgia   . 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 History of  stomach ulcers   . Hypertension   . IBS (irritable bowel syndrome)   . Neuropathy   . Plantar fasciitis   . RLS (restless legs syndrome)     Patient Active Problem List   Diagnosis Date Noted  . 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 Sleep - wake disorder 08/04/2015  . Smoking greater than 30 pack years 08/04/2015    Past Surgical History:  Procedure Laterality Date  . ABDOMINAL HYSTERECTOMY    . BIOPSY THYROID    . BREAST REDUCTION SURGERY    . TONSILLECTOMY    . TUBAL LIGATION       OB History   None      Home Medications    Prior to Admission medications   Medication Sig Start Date End Date Taking? Authorizing Provider  albuterol (PROVENTIL HFA;VENTOLIN HFA) 108 (90 Base) MCG/ACT inhaler Inhale into the lungs every 6 (six) hours as needed for wheezing or shortness of breath.    [provider]  ALPRAZolam Duanne Moron) 1 MG tablet Take 1 mg by mouth 2 (two) times daily.    [provider]  aspirin 81 MG tablet Take 81 mg by mouth daily.    [provider]  carvedilol (COREG) 25 MG tablet Take 25 mg by mouth  2 (two) times daily with a meal.    [provider]  Cholecalciferol (VITAMIN D3) 2000 units TABS Take by mouth.    [provider]  clonazePAM (KLONOPIN) 2 MG tablet Take 2 mg by mouth 2 (two) times daily.    [provider]  cyclobenzaprine (FLEXERIL) 10 MG tablet Take 10 mg by mouth. 06/04/14   [provider]  fluconazole (DIFLUCAN) 150 MG tablet Take 150 mg by mouth daily.    [provider]  fluticasone (FLONASE) 50 MCG/ACT nasal spray Place into both nostrils daily.    [provider]  Fluticasone-Salmeterol (ADVAIR) 250-50 MCG/DOSE AEPB Inhale 1 puff into the lungs 2 (two) times daily.    [provider]  glimepiride (AMARYL) 2 MG tablet Take 2 mg by mouth daily with breakfast.    [provider]  l-methylfolate-B6-B12 (METANX) 3-35-2 MG TABS tablet Take 1  tablet by mouth daily.    [provider]  linaclotide (LINZESS) 145 MCG CAPS capsule Take 145 mcg by mouth daily before breakfast.    [provider]  Magnesium 250 MG TABS Take 1,000 mg by mouth.    [provider]  metFORMIN (GLUCOPHAGE) 1000 MG tablet 1,000 mg. 11/29/13   [provider]  milk thistle 175 MG tablet Take 175 mg by mouth daily.    [provider]  omeprazole (PRILOSEC) 20 MG capsule Take 20 mg by mouth daily.    [provider]  pregabalin (LYRICA) 50 MG capsule Take 50 mg by mouth. 09/24/14   [provider]  Red Yeast Rice Extract (RED YEAST RICE PO) Take by mouth.    [provider]  sertraline (ZOLOFT) 100 MG tablet Take 100 mg by mouth daily.    [provider]  TURMERIC PO Take by mouth.    [provider]  venlafaxine XR (EFFEXOR-XR) 75 MG 24 hr capsule Take 225 mg by mouth. Take 3 capsules once daily 12/09/12   [provider]  vitamin B-12 (CYANOCOBALAMIN) 100 MCG tablet Take 100 mcg by mouth daily.    [provider]    Family History Family History  Problem Relation Age of Onset  . Diabetes Father   . Stroke Father   . Cancer Father   . Arthritis Father   . Bipolar disorder Mother   . Breast cancer Mother     Social History Social History   Tobacco Use  . Smoking status: Current Every Day Smoker    Packs/day: 1.00    Years: 40.00    Pack years: 40.00    Types: Cigarettes  . Smokeless tobacco: Never Used  Substance Use Topics  . Alcohol use: No    Alcohol/week: 0.0 standard drinks  . Drug use: No     Allergies   Topiramate; Amitriptyline; Exenatide; Gabapentin; Oxycodone-acetaminophen; Trulicity [dulaglutide]; Valproic acid; and Sulfa antibiotics   Review of Systems Review of Systems  Constitutional: Negative for chills and fever.  HENT: Negative.   Eyes: Negative for visual disturbance.  Respiratory: Negative for cough and shortness  of breath.   Cardiovascular: Positive for chest pain.  Gastrointestinal: Negative for abdominal pain, nausea and vomiting.  Genitourinary: Negative for dysuria and frequency.  Musculoskeletal: Positive for arthralgias, myalgias and neck pain.  Skin: Negative for color change, rash and wound.  Neurological: Negative for dizziness, syncope, weakness, light-headedness and numbness.     Physical Exam Updated Vital Signs BP (!) 152/90 (BP Location: Right Arm)   Pulse 80  Resp 19   SpO2 97%   Physical Exam  Constitutional: She is oriented to person, place, and time. She appears well-developed and well-nourished. No distress.  HENT:  Head: Normocephalic and atraumatic.  Mouth/Throat: Oropharynx is clear and moist.  Eyes: Right eye exhibits no discharge. Left eye exhibits no discharge.  Neck: Neck supple.  C-spine without focal midline tenderness  Cardiovascular: Normal rate, regular rhythm, normal heart sounds and intact distal pulses.  Pulmonary/Chest: Effort normal and breath sounds normal. No respiratory distress.  Respirations equal and unlabored, patient able to speak in full sentences, lungs clear to auscultation bilaterally, tenderness over the left lateral chest wall and underneath the left breast, there is no palpable deformity and no overlying ecchymosis, or erythema, no crepitus.  The left breast is tender to palpation but there are no palpable masses no overlying skin changes or discoloration  Abdominal: Soft. Bowel sounds are normal. She exhibits no distension and no mass. There is no tenderness. There is no guarding.  Abdomen soft, nondistended, nontender to palpation in all quadrants without guarding or peritoneal signs  Musculoskeletal:  T-spine and L-spine nontender to palpation at midline. Patient moves all extremities without difficulty. All joints supple and easily movable, no erythema, swelling or palpable deformity, all compartments soft.  Neurological: She is alert  and oriented to person, place, and time. Coordination normal.  Speech is clear, able to follow commands CN III-XII intact Normal strength in upper and lower extremities bilaterally including dorsiflexion and plantar flexion, strong and equal grip strength Sensation normal to light and sharp touch Moves extremities without ataxia, coordination intact  Skin: Skin is warm and dry. Capillary refill takes less than 2 seconds. She is not diaphoretic.  Psychiatric: She has a normal mood and affect. Her behavior is normal.  Nursing note and vitals reviewed.    ED Treatments / Results  Labs (all labs ordered are listed, but only abnormal results are displayed) Labs Reviewed  I-STAT CHEM 8, ED - Abnormal; Notable for the following components:      Result Value   Glucose, Bld 120 (*)    All other components within normal limits    EKG EKG Interpretation  Date/Time:  Saturday January 14 2018 13:06:09 EDT Ventricular Rate:  74 PR Interval:  156 QRS Duration: 90 QT Interval:  384 QTC Calculation: 426 R Axis:   49 Text Interpretation:  Normal sinus rhythm Low voltage QRS Borderline ECG Confirmed by Nat Christen [phone]) on 01/14/2018 1:16:01 PM   Radiology Dg Ribs Unilateral W/chest Left  Result Date: 01/14/2018 CLINICAL DATA:  Status post fall at home 2 days ago. EXAM: LEFT RIBS AND CHEST - 3+ VIEW COMPARISON:  None. FINDINGS: Acute nondisplaced fracture of the left tenth posterolateral rib. Acute nondisplaced fracture of left posterolateral sixth rib. There is no evidence of pneumothorax or pleural effusion. Both lungs are clear. Heart size and mediastinal contours are within normal limits. IMPRESSION: Acute nondisplaced fracture of the left tenth posterolateral rib. Acute nondisplaced fracture of left posterolateral sixth rib. Electronically Signed   By: Kathreen Devoid   On: 01/14/2018 13:57   Ct Chest W Contrast  Result Date: 01/14/2018 CLINICAL DATA:  Pain in left axilla and lateral  breast. EXAM: CT CHEST WITH CONTRAST TECHNIQUE: Multidetector CT imaging of the chest was performed during intravenous contrast administration. CONTRAST:  169mL OMNIPAQUE IOHEXOL 300 MG/ML  SOLN COMPARISON:  Chest x-ray January 14, 2018 FINDINGS: Cardiovascular: No thoracic aortic aneurysm, dissection, or atherosclerosis. No coronary artery calcifications noted.  The heart is unremarkable. The central pulmonary arteries are unremarkable. Mediastinum/Nodes: No enlarged mediastinal, hilar, or axillary lymph nodes. Thyroid gland, trachea, and esophagus demonstrate no significant findings. Lungs/Pleura: Central airways are normal. No pneumothorax. There is a 3.4 mm nodule in the left base on series 7, image 96. No other nodules. No masses or infiltrates. Upper Abdomen: Hepatic steatosis. Musculoskeletal: There is a mildly displaced fracture through the anterolateral left fifth rib. No other rib fractures identified. IMPRESSION: 1. Mildly displaced anterolateral left fifth rib fracture. No pneumothorax. 2. 3.4 mm nodule in the left lung base. No follow-up needed if patient is low-risk. Non-contrast chest CT can be considered in 12 months if patient is high-risk. This recommendation follows the consensus statement: Guidelines for Management of Incidental Pulmonary Nodules Detected on CT Images: From the Fleischner Society 2017; Radiology 2017; 284:228-243. Electronically Signed   By: Dorise Bullion III M.D   On: 01/14/2018 15:47    Procedures Procedures (including critical care time)  Medications Ordered in ED Medications  lidocaine (LIDODERM) 5 % 1 patch (1 patch Transdermal Patch Applied 01/14/18 1619)  morphine 4 MG/ML injection 4 mg (4 mg Intravenous Given 01/14/18 1353)  iohexol (OMNIPAQUE) 300 MG/ML solution 75 mL (100 mLs Intravenous Contrast Given 01/14/18 1509)  oxyCODONE (Oxy IR/ROXICODONE) immediate release tablet 10 mg (10 mg Oral Given 01/14/18 1619)     Initial Impression / Assessment and Plan /  ED Course  I have reviewed the triage vital signs and the nursing notes.  Pertinent labs & imaging results that were available during my care of the patient were reviewed by me and considered in my medical decision making (see chart for details).  She presents to the emergency department for evaluation after fall landing on her left side with pain over the left ribs and under the left breast.  On arrival she is mildly hypertensive but vitals are otherwise normal she appears to be in pain but is in no acute distress.  She is not hypoxic or tachypneic.  She has tenderness over the left lateral lobes but there is no palpable deformity, no overlying ecchymosis and no palpable crepitus.  No ecchymosis over the breast but it is tender to palpation.  There is no abdominal tenderness.  Patient did not hit her head and has had no new neck or back pain, does have a history of chronic cervical disc disease for which she receives spinal injections.  We will get lateral rib and chest x-ray and give morphine for pain.  X-ray shows possible nondisplaced fractures of the posterior lateral sixth and 10th ribs but this does not seem in line with where patient is localizing pain will get CT scan of the chest with contrast to better characterize injury.  Will get i-STAT Chem-8 to check creatinine.  Creatinine within normal limits.  CT shows only displaced anterior lateral left fifth rib fracture with no evidence of pneumothorax.  Incidental finding of 3.4 mm lung nodule, will have patient follow-up with her primary care doctor regarding this.  Pain has improved with management here in the emergency department.  Patient was ambulatory in the department although after certain distance she did start to feel overwhelmed by pain, not only the acute pain from her rib fracture but also her chronic pain, but she did not feel lightheaded or dizzy and did not have a near syncopal event.  Patient has a walker at home but I have  encouraged her to use to help her feel more stable, and  she has been encouraged to only go short distances without assistance.  Patient reports she would really like to go home and is in agreement with this.  Norco and lidocaine patches for pain management.  Patient provided incentive spirometer and counseled on appropriate use.  Stable for discharge home at this time.  Return precautions discussed.  Patient expresses understanding and is in agreement with plan.  Final Clinical Impressions(s) / ED Diagnoses   Final diagnoses:  Closed fracture of one rib of left side, initial encounter  Chest wall pain  Pulmonary nodule    ED Discharge Orders         Ordered    oxyCODONE (ROXICODONE) 5 MG immediate release tablet  Every 6 hours PRN     01/14/18 1615    lidocaine (LIDODERM) 5 %  Every 24 hours     01/14/18 1615           Jacqlyn Larsen, PA-C 01/14/18 1707    Nat Christen, MD 01/15/18 [phone]

## 2018-02-23 ENCOUNTER — Other Ambulatory Visit: Payer: Self-pay | Admitting: Endocrinology

## 2018-02-23 DIAGNOSIS — Z1231 Encounter for screening mammogram for malignant neoplasm of breast: Secondary | ICD-10-CM

## 2018-04-04 ENCOUNTER — Ambulatory Visit (HOSPITAL_COMMUNITY): Payer: Self-pay | Admitting: Orthopedic Surgery

## 2018-04-04 ENCOUNTER — Ambulatory Visit
Admission: RE | Admit: 2018-04-04 | Discharge: 2018-04-04 | Disposition: A | Discharge status: Home / Self Care | Payer: Medicare Other | Source: Ambulatory Visit | Attending: Endocrinology | Admitting: Endocrinology

## 2018-04-04 DIAGNOSIS — Z1231 Encounter for screening mammogram for malignant neoplasm of breast: Secondary | ICD-10-CM

## 2018-05-02 NOTE — Pre-Procedure Instructions (Signed)
Tami Turner [facility]  05/02/2018      [id number] [name], [place] AT [facility] [name] [address] Phone: [phone] Fax: [phone]    Your procedure is scheduled on 05/11/18.  Report to [facility] at 530 A.M.  Call this number if you have problems the morning of surgery:  [phone]   Remember:  Do not eat or drink after midnight.      Take these medicines the morning of surgery with A SIP OF WATER ---all inhalers,xanax    Do not wear jewelry, make-up or nail polish.  Do not wear lotions, powders, or perfumes, or deodorant.  Do not shave 48 hours prior to surgery.  Men may shave face and neck.  Do not bring valuables to the hospital.  [facility] is not responsible for any belongings or valuables.  Contacts, dentures or bridgework may not be worn into surgery.  Leave your suitcase in the car.  After surgery it may be brought to your room.  For patients admitted to the hospital, discharge time will be determined by your treatment team.  Patients discharged the day of surgery will not be allowed to drive home.   Name and phone number of your driver:    Special instructions:  [place] - Preparing for Surgery  Before surgery, you can play an important role.  Because skin is not sterile, your skin needs to be as free of germs as possible.  You can reduce the number of germs on you skin by washing with CHG (chlorahexidine gluconate) soap before surgery.  CHG is an antiseptic cleaner which kills germs and bonds with the skin to continue killing germs even after washing.  Oral Hygiene is also important in reducing the risk of infection.  Remember to brush your teeth with your regular toothpaste the morning of surgery.  Please DO NOT use if you have an allergy to CHG or antibacterial soaps.  If your skin becomes reddened/irritated stop using the CHG and inform your  nurse when you arrive at Short Stay.  Do not shave (including legs and underarms) for at least 48 hours prior to the first CHG shower.  You may shave your face.  Please follow these instructions carefully:   1.  Shower with CHG Soap the night before surgery and the morning of Surgery.  2.  If you choose to wash your hair, wash your hair first as usual with your normal shampoo.  3.  After you shampoo, rinse your hair and body thoroughly to remove the shampoo. 4.  Use CHG as you would any other liquid soap.  You can apply chg directly to the skin and wash gently with a      scrungie or washcloth.           5.  Apply the CHG Soap to your body ONLY FROM THE NECK DOWN.   Do not use on open wounds or open sores. Avoid contact with your eyes, ears, mouth and genitals (private parts).  Wash genitals (private parts) with your normal soap.  6.  Wash thoroughly, paying special attention to the area where your surgery will be performed.  7.  Thoroughly rinse your body with warm water from the neck down.  8.  DO NOT shower/wash with your normal soap after using and rinsing off the CHG Soap.  9.  Pat yourself dry with a clean towel.  10.  Wear clean pajamas.            11.  Place clean sheets on your bed the night of your first shower and do not sleep with pets.  Day of Surgery  Do not apply any lotions/deoderants the morning of surgery.   Please wear clean clothes to the hospital/surgery center. Remember to brush your teeth with toothpaste.  Do not take any aspirin,anti-inflammatories,vitamins,or herbal supplements 5-7 days prior to surgery. Please read over the following fact sheets that you were given. MRSA Information

## 2018-05-03 ENCOUNTER — Encounter (HOSPITAL_COMMUNITY)
Admission: RE | Admit: 2018-05-03 | Discharge: 2018-05-03 | Disposition: A | Discharge status: Home / Self Care | Payer: Medicare Other | Source: Ambulatory Visit | Attending: Orthopedic Surgery | Admitting: Orthopedic Surgery

## 2018-05-03 ENCOUNTER — Encounter (HOSPITAL_COMMUNITY): Payer: Self-pay

## 2018-05-03 DIAGNOSIS — Z01812 Encounter for preprocedural laboratory examination: Secondary | ICD-10-CM | POA: Diagnosis not present

## 2018-05-03 HISTORY — DX: Adverse effect of unspecified anesthetic, initial encounter: T41.45XA

## 2018-05-03 HISTORY — DX: Other complications of anesthesia, initial encounter: T88.59XA

## 2018-05-03 LAB — CBC
HCT: 46.2 % — ABNORMAL HIGH (ref 36.0–46.0)
Hemoglobin: 15.8 g/dL — ABNORMAL HIGH (ref 12.0–15.0)
MCH: 30.7 pg (ref 26.0–34.0)
MCHC: 34.2 g/dL (ref 30.0–36.0)
MCV: 89.9 fL (ref 80.0–100.0)
Platelets: 358 10*3/uL (ref 150–400)
RBC: 5.14 MIL/uL — AB (ref 3.87–5.11)
RDW: 12.1 % (ref 11.5–15.5)
WBC: 12.7 10*3/uL — ABNORMAL HIGH (ref 4.0–10.5)
nRBC: 0 % (ref 0.0–0.2)

## 2018-05-03 LAB — BASIC METABOLIC PANEL
Anion gap: 12 (ref 5–15)
BUN: 11 mg/dL (ref 6–20)
CO2: 24 mmol/L (ref 22–32)
Calcium: 9.7 mg/dL (ref 8.9–10.3)
Chloride: 102 mmol/L (ref 98–111)
Creatinine, Ser: 0.77 mg/dL (ref 0.44–1.00)
GFR calc Af Amer: >60 mL/min (ref >60)
GFR calc non Af Amer: >60 mL/min (ref >60)
Glucose, Bld: 119 mg/dL — ABNORMAL HIGH (ref 70–99)
Potassium: 4.5 mmol/L (ref 3.5–5.1)
SODIUM: 138 mmol/L (ref 135–145)

## 2018-05-03 LAB — HEMOGLOBIN A1C
Hgb A1c MFr Bld: 7.4 % — ABNORMAL HIGH (ref 4.8–5.6)
Mean Plasma Glucose: 165.68 mg/dL

## 2018-05-03 LAB — GLUCOSE, CAPILLARY: Glucose-Capillary: 151 mg/dL — ABNORMAL HIGH (ref 70–99)

## 2018-05-03 LAB — ABO/RH: ABO/RH(D): A POS

## 2018-05-03 LAB — TYPE AND SCREEN
ABO/RH(D): A POS
Antibody Screen: NEGATIVE

## 2018-05-03 LAB — SURGICAL PCR SCREEN
MRSA, PCR: NEGATIVE
Staphylococcus aureus: NEGATIVE

## 2018-05-03 NOTE — Progress Notes (Signed)
PCP - Dr  Gordy Councilman   [facility] Cardiologist - pt stated none  EKG - 9/19 Stress Test - denied ECHO -denied  Cardiac Cath - denied  Sleep Study - dones not remember when CPAP -   Fasting Blood Sugar -151   Not fasting      Patient denies shortness of breath, fever, cough and chest pain at PAT appointment   Patient verbalized understanding of instructions that were given to them at the PAT appointment. Patient was also instructed that they will need to review over the PAT instructions again at home before surgery.

## 2018-05-10 MED ORDER — TRANEXAMIC ACID-NACL 1000-0.7 MG/100ML-% IV SOLN
1000.0000 mg | INTRAVENOUS | Status: AC
  Administered 2018-05-11: 1000 mg via INTRAVENOUS
  Filled 2018-05-10: qty 100

## 2018-05-10 NOTE — Anesthesia Preprocedure Evaluation (Addendum)
Anesthesia Evaluation  Patient identified by MRN, date of birth, ID band Patient awake    Reviewed: Allergy & Precautions, H&P , NPO status , Patient's Chart, lab work & pertinent test results  History of Anesthesia Complications (+) history of anesthetic complications  Airway Mallampati: I  TM Distance: >3 FB Neck ROM: Full    Dental no notable dental hx. (+) Teeth Intact, Dental Advisory Given, Implants   Pulmonary neg pulmonary ROS, asthma , Current Smoker,    Pulmonary exam normal breath sounds clear to auscultation       Cardiovascular Exercise Tolerance: Good hypertension, Pt. on medications Normal cardiovascular exam Rhythm:Regular Rate:Normal     Neuro/Psych PSYCHIATRIC DISORDERS Anxiety Depression  Neuromuscular disease    GI/Hepatic negative GI ROS, Neg liver ROS,   Endo/Other  diabetes, Oral Hypoglycemic Agents  Renal/GU negative Renal ROS  negative genitourinary   Musculoskeletal  (+) Arthritis , Osteoarthritis,  Fibromyalgia -  Abdominal   Peds negative pediatric ROS (+)  Hematology negative hematology ROS (+)   Anesthesia Other Findings   Reproductive/Obstetrics negative OB ROS                            Anesthesia Physical Anesthesia Plan  ASA: II  Anesthesia Plan: General   Post-op Pain Management:    Induction:   PONV Risk Score and Plan: 2 and Ondansetron, Treatment may vary due to age or medical condition and Dexamethasone  Airway Management Planned: Oral ETT  Additional Equipment: None  Intra-op Plan:   Post-operative Plan: Extubation in OR  Informed Consent: I have reviewed the patients History and Physical, chart, labs and discussed the procedure including the risks, benefits and alternatives for the proposed anesthesia with the patient or authorized representative who has indicated his/her understanding and acceptance.     Dental advisory  given  Plan Discussed with: CRNA, Anesthesiologist and Surgeon  Anesthesia Plan Comments:        Anesthesia Quick Evaluation

## 2018-05-11 ENCOUNTER — Inpatient Hospital Stay (HOSPITAL_COMMUNITY): Payer: Medicare Other | Admitting: Certified Registered"

## 2018-05-11 ENCOUNTER — Other Ambulatory Visit: Payer: Self-pay

## 2018-05-11 ENCOUNTER — Inpatient Hospital Stay (HOSPITAL_COMMUNITY): Payer: Medicare Other

## 2018-05-11 ENCOUNTER — Inpatient Hospital Stay (HOSPITAL_COMMUNITY)
Admission: RE | Disposition: A | Discharge status: Home / Self Care | Payer: Self-pay | Source: Home / Self Care | Attending: Orthopedic Surgery

## 2018-05-11 ENCOUNTER — Encounter (HOSPITAL_COMMUNITY): Payer: Self-pay | Admitting: Certified Registered"

## 2018-05-11 ENCOUNTER — Inpatient Hospital Stay (HOSPITAL_COMMUNITY)
Admission: RE | Admit: 2018-05-11 | Discharge: 2018-05-12 | DRG: 460 | Disposition: A | Discharge status: Home / Self Care | Payer: Medicare Other | Attending: Orthopedic Surgery | Admitting: Orthopedic Surgery

## 2018-05-11 DIAGNOSIS — G2581 Restless legs syndrome: Secondary | ICD-10-CM | POA: Diagnosis present

## 2018-05-11 DIAGNOSIS — F17210 Nicotine dependence, cigarettes, uncomplicated: Secondary | ICD-10-CM | POA: Diagnosis present

## 2018-05-11 DIAGNOSIS — Z833 Family history of diabetes mellitus: Secondary | ICD-10-CM

## 2018-05-11 DIAGNOSIS — F329 Major depressive disorder, single episode, unspecified: Secondary | ICD-10-CM | POA: Diagnosis present

## 2018-05-11 DIAGNOSIS — Z85841 Personal history of malignant neoplasm of brain: Secondary | ICD-10-CM | POA: Diagnosis not present

## 2018-05-11 DIAGNOSIS — F419 Anxiety disorder, unspecified: Secondary | ICD-10-CM | POA: Diagnosis present

## 2018-05-11 DIAGNOSIS — Z8261 Family history of arthritis: Secondary | ICD-10-CM

## 2018-05-11 DIAGNOSIS — Z79899 Other long term (current) drug therapy: Secondary | ICD-10-CM | POA: Diagnosis not present

## 2018-05-11 DIAGNOSIS — Z7982 Long term (current) use of aspirin: Secondary | ICD-10-CM | POA: Diagnosis not present

## 2018-05-11 DIAGNOSIS — J45909 Unspecified asthma, uncomplicated: Secondary | ICD-10-CM | POA: Diagnosis present

## 2018-05-11 DIAGNOSIS — Z965 Presence of tooth-root and mandibular implants: Secondary | ICD-10-CM | POA: Diagnosis present

## 2018-05-11 DIAGNOSIS — M797 Fibromyalgia: Secondary | ICD-10-CM | POA: Diagnosis present

## 2018-05-11 DIAGNOSIS — Z8711 Personal history of peptic ulcer disease: Secondary | ICD-10-CM | POA: Diagnosis not present

## 2018-05-11 DIAGNOSIS — K760 Fatty (change of) liver, not elsewhere classified: Secondary | ICD-10-CM | POA: Diagnosis present

## 2018-05-11 DIAGNOSIS — M5116 Intervertebral disc disorders with radiculopathy, lumbar region: Secondary | ICD-10-CM | POA: Diagnosis present

## 2018-05-11 DIAGNOSIS — E119 Type 2 diabetes mellitus without complications: Secondary | ICD-10-CM | POA: Diagnosis present

## 2018-05-11 DIAGNOSIS — M4316 Spondylolisthesis, lumbar region: Secondary | ICD-10-CM | POA: Diagnosis present

## 2018-05-11 DIAGNOSIS — Z7951 Long term (current) use of inhaled steroids: Secondary | ICD-10-CM

## 2018-05-11 DIAGNOSIS — G8929 Other chronic pain: Secondary | ICD-10-CM | POA: Diagnosis present

## 2018-05-11 DIAGNOSIS — Z90710 Acquired absence of both cervix and uterus: Secondary | ICD-10-CM | POA: Diagnosis not present

## 2018-05-11 DIAGNOSIS — I10 Essential (primary) hypertension: Secondary | ICD-10-CM | POA: Diagnosis present

## 2018-05-11 DIAGNOSIS — M48061 Spinal stenosis, lumbar region without neurogenic claudication: Secondary | ICD-10-CM | POA: Diagnosis present

## 2018-05-11 DIAGNOSIS — M5136 Other intervertebral disc degeneration, lumbar region: Secondary | ICD-10-CM | POA: Diagnosis present

## 2018-05-11 DIAGNOSIS — Z888 Allergy status to other drugs, medicaments and biological substances status: Secondary | ICD-10-CM | POA: Diagnosis not present

## 2018-05-11 DIAGNOSIS — G4731 Primary central sleep apnea: Secondary | ICD-10-CM | POA: Diagnosis present

## 2018-05-11 DIAGNOSIS — Z419 Encounter for procedure for purposes other than remedying health state, unspecified: Secondary | ICD-10-CM

## 2018-05-11 HISTORY — PX: TRANSFORAMINAL LUMBAR INTERBODY FUSION (TLIF) WITH PEDICLE SCREW FIXATION 1 LEVEL: SHX6141

## 2018-05-11 LAB — GLUCOSE, CAPILLARY
Glucose-Capillary: 157 mg/dL — ABNORMAL HIGH (ref 70–99)
Glucose-Capillary: 146 mg/dL — ABNORMAL HIGH (ref 70–99)
Glucose-Capillary: 134 mg/dL — ABNORMAL HIGH (ref 70–99)

## 2018-05-11 LAB — HEMOGLOBIN A1C
Hgb A1c MFr Bld: 7.4 % — ABNORMAL HIGH (ref 4.8–5.6)
Mean Plasma Glucose: 165.68 mg/dL

## 2018-05-11 SURGERY — TRANSFORAMINAL LUMBAR INTERBODY FUSION (TLIF) WITH PEDICLE SCREW FIXATION 1 LEVEL
Anesthesia: General | Site: Back

## 2018-05-11 MED ORDER — DEXAMETHASONE SODIUM PHOSPHATE 10 MG/ML IJ SOLN
INTRAMUSCULAR | Status: AC
  Filled 2018-05-11: qty 1

## 2018-05-11 MED ORDER — SODIUM CHLORIDE 0.9% FLUSH: 3.0000 mL | Freq: Two times a day (BID) | INTRAVENOUS | Status: DC

## 2018-05-11 MED ORDER — ALPRAZOLAM 0.5 MG PO TABS
1.0000 mg | ORAL_TABLET | Freq: Every day | ORAL | Status: DC | PRN
  Administered 2018-05-11: 1 mg via ORAL
  Filled 2018-05-11: qty 2

## 2018-05-11 MED ORDER — SUFENTANIL CITRATE 50 MCG/ML IV SOLN
INTRAVENOUS | Status: DC | PRN
  Administered 2018-05-11 (×3): 5 ug via INTRAVENOUS
  Administered 2018-05-11: 25 ug via INTRAVENOUS
  Administered 2018-05-11: 10 ug via INTRAVENOUS

## 2018-05-11 MED ORDER — EPHEDRINE SULFATE-NACL 50-0.9 MG/10ML-% IV SOSY
PREFILLED_SYRINGE | INTRAVENOUS | Status: DC | PRN
  Administered 2018-05-11: 10 mg via INTRAVENOUS

## 2018-05-11 MED ORDER — ALBUTEROL SULFATE (2.5 MG/3ML) 0.083% IN NEBU: 3.0000 mL | INHALATION_SOLUTION | Freq: Four times a day (QID) | RESPIRATORY_TRACT | Status: DC | PRN

## 2018-05-11 MED ORDER — METHOCARBAMOL 500 MG PO TABS
500.0000 mg | ORAL_TABLET | Freq: Four times a day (QID) | ORAL | Status: DC | PRN
  Administered 2018-05-11 (×2): 500 mg via ORAL
  Filled 2018-05-11: qty 1

## 2018-05-11 MED ORDER — CEFAZOLIN SODIUM-DEXTROSE 2-4 GM/100ML-% IV SOLN
2.0000 g | INTRAVENOUS | Status: AC
  Administered 2018-05-11: 2 g via INTRAVENOUS
  Filled 2018-05-11: qty 100

## 2018-05-11 MED ORDER — ACETAMINOPHEN 160 MG/5ML PO SOLN: 325.0000 mg | ORAL | Status: DC | PRN

## 2018-05-11 MED ORDER — MEPERIDINE HCL 50 MG/ML IJ SOLN: 6.2500 mg | INTRAMUSCULAR | Status: DC | PRN

## 2018-05-11 MED ORDER — DEXAMETHASONE SODIUM PHOSPHATE 10 MG/ML IJ SOLN
INTRAMUSCULAR | Status: DC | PRN
  Administered 2018-05-11: 4 mg via INTRAVENOUS

## 2018-05-11 MED ORDER — SODIUM CHLORIDE 0.9% FLUSH: 3.0000 mL | INTRAVENOUS | Status: DC | PRN

## 2018-05-11 MED ORDER — LACTATED RINGERS IV SOLN
INTRAVENOUS | Status: DC | PRN
  Administered 2018-05-11 (×2): via INTRAVENOUS

## 2018-05-11 MED ORDER — FENTANYL CITRATE (PF) 100 MCG/2ML IJ SOLN
INTRAMUSCULAR | Status: AC
  Filled 2018-05-11: qty 2

## 2018-05-11 MED ORDER — INSULIN GLARGINE 100 UNIT/ML ~~LOC~~ SOLN
25.0000 [IU] | Freq: Every day | SUBCUTANEOUS | Status: DC
  Administered 2018-05-11: 25 [IU] via SUBCUTANEOUS
  Filled 2018-05-11 (×2): qty 0.25

## 2018-05-11 MED ORDER — ONDANSETRON HCL 4 MG/2ML IJ SOLN: 4.0000 mg | Freq: Once | INTRAMUSCULAR | Status: DC | PRN

## 2018-05-11 MED ORDER — FENTANYL CITRATE (PF) 100 MCG/2ML IJ SOLN
25.0000 ug | INTRAMUSCULAR | Status: DC | PRN
  Administered 2018-05-11: 50 ug via INTRAVENOUS

## 2018-05-11 MED ORDER — ROCURONIUM BROMIDE 50 MG/5ML IV SOSY
PREFILLED_SYRINGE | INTRAVENOUS | Status: AC
  Filled 2018-05-11: qty 5

## 2018-05-11 MED ORDER — PROPOFOL 10 MG/ML IV BOLUS
INTRAVENOUS | Status: DC | PRN
  Administered 2018-05-11: 200 mg via INTRAVENOUS

## 2018-05-11 MED ORDER — METHYLPREDNISOLONE ACETATE 40 MG/ML IJ SUSP
INTRAMUSCULAR | Status: DC | PRN
  Administered 2018-05-11: 40 mg

## 2018-05-11 MED ORDER — FLUTICASONE PROPIONATE 50 MCG/ACT NA SUSP
1.0000 | Freq: Every day | NASAL | Status: DC
  Administered 2018-05-11: 1 via NASAL
  Filled 2018-05-11: qty 16

## 2018-05-11 MED ORDER — LACTATED RINGERS IV SOLN: INTRAVENOUS | Status: DC

## 2018-05-11 MED ORDER — ONDANSETRON HCL 4 MG/2ML IJ SOLN
INTRAMUSCULAR | Status: DC | PRN
  Administered 2018-05-11: 4 mg via INTRAVENOUS

## 2018-05-11 MED ORDER — HEMOSTATIC AGENTS (NO CHARGE) OPTIME
TOPICAL | Status: DC | PRN
  Administered 2018-05-11: 1 via TOPICAL

## 2018-05-11 MED ORDER — HYDROMORPHONE HCL 1 MG/ML IJ SOLN: 1.0000 mg | INTRAMUSCULAR | Status: AC | PRN

## 2018-05-11 MED ORDER — MIDAZOLAM HCL 2 MG/2ML IJ SOLN
INTRAMUSCULAR | Status: AC
  Filled 2018-05-11: qty 2

## 2018-05-11 MED ORDER — LIDOCAINE 2% (20 MG/ML) 5 ML SYRINGE
INTRAMUSCULAR | Status: AC
  Filled 2018-05-11: qty 5

## 2018-05-11 MED ORDER — ACETAMINOPHEN 325 MG PO TABS: 325.0000 mg | ORAL_TABLET | ORAL | Status: DC | PRN

## 2018-05-11 MED ORDER — ACETAMINOPHEN 325 MG PO TABS: 650.0000 mg | ORAL_TABLET | ORAL | Status: DC | PRN

## 2018-05-11 MED ORDER — BUPIVACAINE-EPINEPHRINE 0.25% -1:200000 IJ SOLN
INTRAMUSCULAR | Status: AC
  Filled 2018-05-11: qty 1

## 2018-05-11 MED ORDER — SODIUM CHLORIDE (PF) 0.9 % IJ SOLN
INTRAMUSCULAR | Status: AC
  Filled 2018-05-11: qty 10

## 2018-05-11 MED ORDER — MENTHOL 3 MG MT LOZG
1.0000 | LOZENGE | OROMUCOSAL | Status: DC | PRN
  Filled 2018-05-11: qty 9

## 2018-05-11 MED ORDER — OXYCODONE HCL 5 MG PO TABS
ORAL_TABLET | ORAL | Status: AC
  Filled 2018-05-11: qty 1

## 2018-05-11 MED ORDER — SUCCINYLCHOLINE CHLORIDE 200 MG/10ML IV SOSY
PREFILLED_SYRINGE | INTRAVENOUS | Status: AC
  Filled 2018-05-11: qty 10

## 2018-05-11 MED ORDER — POLYETHYLENE GLYCOL 3350 17 G PO PACK: 17.0000 g | PACK | Freq: Every day | ORAL | Status: DC | PRN

## 2018-05-11 MED ORDER — OXYCODONE HCL 5 MG PO TABS: 5.0000 mg | ORAL_TABLET | ORAL | Status: DC | PRN

## 2018-05-11 MED ORDER — CEFAZOLIN SODIUM-DEXTROSE 1-4 GM/50ML-% IV SOLN
1.0000 g | Freq: Three times a day (TID) | INTRAVENOUS | Status: AC
  Administered 2018-05-11 (×2): 1 g via INTRAVENOUS
  Filled 2018-05-11 (×2): qty 50

## 2018-05-11 MED ORDER — THROMBIN 20000 UNITS EX SOLR
CUTANEOUS | Status: DC | PRN
  Administered 2018-05-11: 09:00:00 via TOPICAL

## 2018-05-11 MED ORDER — SURGIFOAM 100 EX MISC
CUTANEOUS | Status: DC | PRN
  Administered 2018-05-11: 1 via TOPICAL

## 2018-05-11 MED ORDER — LIDOCAINE 2% (20 MG/ML) 5 ML SYRINGE
INTRAMUSCULAR | Status: DC | PRN
  Administered 2018-05-11: 40 mg via INTRAVENOUS

## 2018-05-11 MED ORDER — OXYCODONE HCL 5 MG PO TABS
5.0000 mg | ORAL_TABLET | Freq: Once | ORAL | Status: AC | PRN
  Administered 2018-05-11: 5 mg via ORAL

## 2018-05-11 MED ORDER — FLUTICASONE FUROATE-VILANTEROL 200-25 MCG/INH IN AEPB
1.0000 | INHALATION_SPRAY | Freq: Every day | RESPIRATORY_TRACT | Status: DC
  Filled 2018-05-11: qty 28

## 2018-05-11 MED ORDER — PHENOL 1.4 % MT LIQD: 1.0000 | OROMUCOSAL | Status: DC | PRN

## 2018-05-11 MED ORDER — PROPOFOL 500 MG/50ML IV EMUL
INTRAVENOUS | Status: DC | PRN
  Administered 2018-05-11: 75 ug/kg/min via INTRAVENOUS

## 2018-05-11 MED ORDER — ONDANSETRON HCL 4 MG/2ML IJ SOLN: 4.0000 mg | Freq: Four times a day (QID) | INTRAMUSCULAR | Status: DC | PRN

## 2018-05-11 MED ORDER — ONDANSETRON HCL 4 MG/2ML IJ SOLN
INTRAMUSCULAR | Status: AC
  Filled 2018-05-11: qty 2

## 2018-05-11 MED ORDER — METHOCARBAMOL 1000 MG/10ML IJ SOLN
500.0000 mg | Freq: Four times a day (QID) | INTRAVENOUS | Status: DC | PRN
  Filled 2018-05-11: qty 5

## 2018-05-11 MED ORDER — ONDANSETRON HCL 4 MG PO TABS: 4.0000 mg | ORAL_TABLET | Freq: Four times a day (QID) | ORAL | Status: DC | PRN

## 2018-05-11 MED ORDER — OXYCODONE HCL 5 MG PO TABS
10.0000 mg | ORAL_TABLET | ORAL | Status: DC | PRN
  Administered 2018-05-11 – 2018-05-12 (×9): 10 mg via ORAL
  Filled 2018-05-11 (×9): qty 2

## 2018-05-11 MED ORDER — PROPOFOL 10 MG/ML IV BOLUS
INTRAVENOUS | Status: AC
  Filled 2018-05-11: qty 20

## 2018-05-11 MED ORDER — EPHEDRINE 5 MG/ML INJ
INTRAVENOUS | Status: AC
  Filled 2018-05-11: qty 10

## 2018-05-11 MED ORDER — SODIUM CHLORIDE 0.9 % IV SOLN: 250.0000 mL | INTRAVENOUS | Status: DC

## 2018-05-11 MED ORDER — SUCCINYLCHOLINE CHLORIDE 20 MG/ML IJ SOLN
INTRAMUSCULAR | Status: DC | PRN
  Administered 2018-05-11: 120 mg via INTRAVENOUS

## 2018-05-11 MED ORDER — 0.9 % SODIUM CHLORIDE (POUR BTL) OPTIME
TOPICAL | Status: DC | PRN
  Administered 2018-05-11 (×2): 1000 mL

## 2018-05-11 MED ORDER — THROMBIN (RECOMBINANT) 20000 UNITS EX SOLR
CUTANEOUS | Status: AC
  Filled 2018-05-11: qty 20000

## 2018-05-11 MED ORDER — METHOCARBAMOL 500 MG PO TABS
ORAL_TABLET | ORAL | Status: AC
  Filled 2018-05-11: qty 1

## 2018-05-11 MED ORDER — INSULIN ASPART 100 UNIT/ML ~~LOC~~ SOLN: 0.0000 [IU] | Freq: Every day | SUBCUTANEOUS | Status: DC

## 2018-05-11 MED ORDER — MIDAZOLAM HCL 5 MG/5ML IJ SOLN
INTRAMUSCULAR | Status: DC | PRN
  Administered 2018-05-11: 2 mg via INTRAVENOUS

## 2018-05-11 MED ORDER — CLONAZEPAM 1 MG PO TABS
2.0000 mg | ORAL_TABLET | Freq: Two times a day (BID) | ORAL | Status: DC
  Administered 2018-05-11 – 2018-05-12 (×2): 2 mg via ORAL
  Filled 2018-05-11 (×2): qty 2

## 2018-05-11 MED ORDER — OXYCODONE HCL 5 MG/5ML PO SOLN: 5.0000 mg | Freq: Once | ORAL | Status: AC | PRN

## 2018-05-11 MED ORDER — GLIMEPIRIDE 2 MG PO TABS
4.0000 mg | ORAL_TABLET | Freq: Two times a day (BID) | ORAL | Status: DC
  Administered 2018-05-11 – 2018-05-12 (×3): 4 mg via ORAL
  Filled 2018-05-11 (×3): qty 2

## 2018-05-11 MED ORDER — METHYLPREDNISOLONE ACETATE 40 MG/ML IJ SUSP
INTRAMUSCULAR | Status: AC
  Filled 2018-05-11: qty 1

## 2018-05-11 MED ORDER — PREGABALIN 50 MG PO CAPS
50.0000 mg | ORAL_CAPSULE | Freq: Two times a day (BID) | ORAL | Status: DC
  Administered 2018-05-11 – 2018-05-12 (×2): 50 mg via ORAL
  Filled 2018-05-11 (×2): qty 1

## 2018-05-11 MED ORDER — BUPIVACAINE-EPINEPHRINE 0.25% -1:200000 IJ SOLN
INTRAMUSCULAR | Status: DC | PRN
  Administered 2018-05-11: 20 mL

## 2018-05-11 MED ORDER — CYCLOBENZAPRINE HCL 10 MG PO TABS
10.0000 mg | ORAL_TABLET | Freq: Three times a day (TID) | ORAL | Status: DC | PRN
  Administered 2018-05-11 – 2018-05-12 (×3): 10 mg via ORAL
  Filled 2018-05-11 (×3): qty 1

## 2018-05-11 MED ORDER — ACETAMINOPHEN 650 MG RE SUPP: 650.0000 mg | RECTAL | Status: DC | PRN

## 2018-05-11 MED ORDER — VENLAFAXINE HCL ER 75 MG PO CP24
225.0000 mg | ORAL_CAPSULE | Freq: Every day | ORAL | Status: DC
  Administered 2018-05-11: 225 mg via ORAL
  Filled 2018-05-11: qty 3

## 2018-05-11 MED ORDER — METFORMIN HCL ER 500 MG PO TB24
1000.0000 mg | ORAL_TABLET | Freq: Two times a day (BID) | ORAL | Status: DC
  Administered 2018-05-11 – 2018-05-12 (×3): 1000 mg via ORAL
  Filled 2018-05-11 (×3): qty 2

## 2018-05-11 MED ORDER — SUFENTANIL CITRATE 50 MCG/ML IV SOLN
INTRAVENOUS | Status: AC
  Filled 2018-05-11: qty 1

## 2018-05-11 MED ORDER — INSULIN ASPART 100 UNIT/ML ~~LOC~~ SOLN
0.0000 [IU] | Freq: Three times a day (TID) | SUBCUTANEOUS | Status: DC
  Administered 2018-05-11: 2 [IU] via SUBCUTANEOUS
  Administered 2018-05-11: 3 [IU] via SUBCUTANEOUS
  Administered 2018-05-12: 2 [IU] via SUBCUTANEOUS

## 2018-05-11 SURGICAL SUPPLY — 79 items
BLADE CLIPPER SURG (BLADE) IMPLANT
CABLE BIPOLOR RESECTION CORD (MISCELLANEOUS) ×3 IMPLANT
CAGE TLIF NANOLOCK 11 (Cage) ×1 IMPLANT
CAGE TLIF NANOLOCK 11MM (Cage) ×1 IMPLANT
CANISTER SUCT 3000ML PPV (MISCELLANEOUS) ×3 IMPLANT
CLIP NEUROVISION LG (CLIP) ×2 IMPLANT
CLOSURE STERI-STRIP 1/2X4 (GAUZE/BANDAGES/DRESSINGS) ×1
CLSR STERI-STRIP ANTIMIC 1/2X4 (GAUZE/BANDAGES/DRESSINGS) ×2 IMPLANT
COVER SURGICAL LIGHT HANDLE (MISCELLANEOUS) ×3 IMPLANT
COVER WAND RF STERILE (DRAPES) ×1 IMPLANT
DRAPE C-ARM 42X72 X-RAY (DRAPES) ×3 IMPLANT
DRAPE C-ARMOR (DRAPES) ×3 IMPLANT
DRAPE POUCH INSTRU U-SHP 10X18 (DRAPES) ×3 IMPLANT
DRAPE SURG 17X23 STRL (DRAPES) ×3 IMPLANT
DRAPE U-SHAPE 47X51 STRL (DRAPES) ×3 IMPLANT
DRSG OPSITE POSTOP 4X6 (GAUZE/BANDAGES/DRESSINGS) ×2 IMPLANT
DRSG OPSITE POSTOP 4X8 (GAUZE/BANDAGES/DRESSINGS) ×3 IMPLANT
DURAPREP 26ML APPLICATOR (WOUND CARE) ×3 IMPLANT
ELECT BLADE 4.0 EZ CLEAN MEGAD (MISCELLANEOUS) ×3
ELECT BLADE 6.5 EXT (BLADE) ×2 IMPLANT
ELECT PENCIL ROCKER SW 15FT (MISCELLANEOUS) ×3 IMPLANT
ELECT REM PT RETURN 9FT ADLT (ELECTROSURGICAL) ×3
ELECTRODE BLDE 4.0 EZ CLN MEGD (MISCELLANEOUS) ×1 IMPLANT
ELECTRODE REM PT RTRN 9FT ADLT (ELECTROSURGICAL) ×1 IMPLANT
GLOVE BIOGEL PI IND STRL 8.5 (GLOVE) ×1 IMPLANT
GLOVE BIOGEL PI INDICATOR 8.5 (GLOVE) ×6
GLOVE SS BIOGEL STRL SZ 8.5 (GLOVE) ×1 IMPLANT
GLOVE SUPERSENSE BIOGEL SZ 8.5 (GLOVE) ×4
GLOVE SURG SS PI 6.5 STRL IVOR (GLOVE) ×2 IMPLANT
GOWN STRL REUS W/ TWL LRG LVL3 (GOWN DISPOSABLE) ×1 IMPLANT
GOWN STRL REUS W/TWL 2XL LVL3 (GOWN DISPOSABLE) ×6 IMPLANT
GOWN STRL REUS W/TWL LRG LVL3 (GOWN DISPOSABLE) ×3
GUIDEWIRE NITINOL BEVEL TIP (WIRE) ×8 IMPLANT
KIT BASIN OR (CUSTOM PROCEDURE TRAY) ×3 IMPLANT
KIT TURNOVER KIT B (KITS) ×3 IMPLANT
LIGHT SOURCE ANGLE TIP STR 7FT (MISCELLANEOUS) ×2 IMPLANT
MODULE EMG NDL SSEP NVM5 (NEEDLE) IMPLANT
MODULE EMG NEEDLE SSEP NVM5 (NEEDLE) ×3 IMPLANT
MODULE NVM5 NEXT GEN EMG (NEEDLE) ×2 IMPLANT
NDL I-PASS III (NEEDLE) IMPLANT
NDL SPNL 18GX3.5 QUINCKE PK (NEEDLE) ×2 IMPLANT
NEEDLE 22X1 1/2 (OR ONLY) (NEEDLE) ×3 IMPLANT
NEEDLE I-PASS III (NEEDLE) ×3 IMPLANT
NEEDLE SPNL 18GX3.5 QUINCKE PK (NEEDLE) ×6 IMPLANT
NS IRRIG 1000ML POUR BTL (IV SOLUTION) ×3 IMPLANT
PACK LAMINECTOMY ORTHO (CUSTOM PROCEDURE TRAY) ×3 IMPLANT
PACK UNIVERSAL I (CUSTOM PROCEDURE TRAY) ×3 IMPLANT
PAD ARMBOARD 7.5X6 YLW CONV (MISCELLANEOUS) ×6 IMPLANT
PATTIES SURGICAL .5 X.5 (GAUZE/BANDAGES/DRESSINGS) IMPLANT
PATTIES SURGICAL .5 X1 (DISPOSABLE) ×3 IMPLANT
POSITIONER HEAD PRONE TRACH (MISCELLANEOUS) ×3 IMPLANT
PROBE BALL TIP NVM5 SNG USE (BALLOONS) ×2 IMPLANT
PUTTY DBX 1CC (Putty) ×3 IMPLANT
PUTTY DBX 1CC DEPUY (Putty) IMPLANT
REDUCTION EXT RELINE MAS MOD (Neuro Prosthesis/Implant) ×2 IMPLANT
ROD RELINE MAS TI LORD 5.5X40 (Rod) ×4 IMPLANT
SCREW LOCK RELINE 5.5 TULIP (Screw) ×8 IMPLANT
SCREW RELINE RED 6.5X45MM POLY (Screw) ×4 IMPLANT
SCREW SHANK RELINE 6.5X45MM 2C (Screw) ×2 IMPLANT
SCREW SHANK RELINE MOD 7.5X45 (Screw) ×3 IMPLANT
SCREW SHANK RLINE MD 7.5X45 2C (Screw) IMPLANT
SHEET CONFORM 45LX20WX5H (Bone Implant) ×2 IMPLANT
SPONGE LAP 4X18 RFD (DISPOSABLE) ×4 IMPLANT
SPONGE SURGIFOAM ABS GEL 100 (HEMOSTASIS) ×3 IMPLANT
SURGIFLO W/THROMBIN 8M KIT (HEMOSTASIS) ×2 IMPLANT
SUT BONE WAX W31G (SUTURE) ×3 IMPLANT
SUT MNCRL AB 3-0 PS2 18 (SUTURE) ×6 IMPLANT
SUT VIC AB 1 CT1 18XCR BRD 8 (SUTURE) ×1 IMPLANT
SUT VIC AB 1 CT1 8-18 (SUTURE) ×3
SUT VIC AB 1 CTX 36 (SUTURE) ×3
SUT VIC AB 1 CTX36XBRD ANBCTR (SUTURE) IMPLANT
SUT VIC AB 2-0 CT1 18 (SUTURE) ×5 IMPLANT
SUT VICRYL 0 UR6 27IN ABS (SUTURE) ×2 IMPLANT
SYR BULB IRRIGATION 50ML (SYRINGE) ×5 IMPLANT
SYR CONTROL 10ML LL (SYRINGE) ×3 IMPLANT
TOWEL GREEN STERILE (TOWEL DISPOSABLE) ×3 IMPLANT
TOWEL GREEN STERILE FF (TOWEL DISPOSABLE) ×3 IMPLANT
WATER STERILE IRR 1000ML POUR (IV SOLUTION) ×3 IMPLANT
YANKAUER SUCT BULB TIP NO VENT (SUCTIONS) ×3 IMPLANT

## 2018-05-11 NOTE — Anesthesia Procedure Notes (Signed)
Procedure Name: Intubation Date/Time: 05/11/2018 7:43 AM Performed by: Moshe Salisbury, CRNA Pre-anesthesia Checklist: Patient identified, Emergency Drugs available, Suction available and Patient being monitored Patient Re-evaluated:Patient Re-evaluated prior to induction Oxygen Delivery Method: Circle System Utilized Preoxygenation: Pre-oxygenation with 100% oxygen Induction Type: IV induction Ventilation: Mask ventilation without difficulty Laryngoscope Size: Mac and 3 Grade View: Grade II Tube type: Oral Tube size: 7.5 mm Number of attempts: 1 Airway Equipment and Method: Stylet Placement Confirmation: ETT inserted through vocal cords under direct vision,  positive ETCO2 and breath sounds checked- equal and bilateral Secured at: 21 cm Tube secured with: Tape Dental Injury: Teeth and Oropharynx as per pre-operative assessment

## 2018-05-11 NOTE — Anesthesia Postprocedure Evaluation (Signed)
Anesthesia Post Note  Patient: Tami Turner  Procedure(s) Performed: TRANSFORAMINAL LUMBAR INTERBODY FUSION (TLIF) L4-5 (N/A Back)     Patient location during evaluation: PACU Anesthesia Type: General Level of consciousness: awake and alert Pain management: pain level controlled Vital Signs Assessment: post-procedure vital signs reviewed and stable Respiratory status: spontaneous breathing, nonlabored ventilation, respiratory function stable and patient connected to nasal cannula oxygen Cardiovascular status: blood pressure returned to baseline and stable Postop Assessment: no apparent nausea or vomiting Anesthetic complications: no    Last Vitals:  Vitals:   05/11/18 1215 05/11/18 1230  BP: 108/73   Pulse: 72 74  Resp: 16 16  Temp:    SpO2: 97% 98%    Last Pain:  Vitals:   05/11/18 1230  PainSc: 7     LLE Motor Response: Purposeful movement;Responds to commands (05/11/18 1230) LLE Sensation: Full sensation;No pain;No numbness;No tingling (05/11/18 1230) RLE Motor Response: Purposeful movement;Responds to commands (05/11/18 1230) RLE Sensation: Full sensation;No numbness;No pain;No tingling (05/11/18 1230)      Chiquita Heckert

## 2018-05-11 NOTE — Transfer of Care (Signed)
Immediate Anesthesia Transfer of Care Note  Patient: Tami Turner  Procedure(s) Performed: TRANSFORAMINAL LUMBAR INTERBODY FUSION (TLIF) L4-5 (N/A Back)  Patient Location: PACU  Anesthesia Type:General  Level of Consciousness: awake and patient cooperative  Airway & Oxygen Therapy: Patient Spontanous Breathing and Patient connected to nasal cannula oxygen  Post-op Assessment: Report given to RN, Post -op Vital signs reviewed and stable and Patient moving all extremities  Post vital signs: Reviewed and stable  Last Vitals:  Vitals Value Taken Time  BP 132/75 05/11/2018 11:17 AM  Temp    Pulse 100 05/11/2018 11:17 AM  Resp 26 05/11/2018 11:18 AM  SpO2 98 % 05/11/2018 11:17 AM  Vitals shown include unvalidated device data.  Last Pain:  Vitals:   05/11/18 0706  PainSc: 3          Complications: No apparent anesthesia complications

## 2018-05-11 NOTE — Op Note (Signed)
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  NuVasive MIS pedicle screws: 6.5 x 45 x 3.  Right L5 pedicle screw: 7.5 x 45 mm screw.  Graft: Autograft, DBX, conform sheet.    Neuro monitoring: Lower extremity SSEPs were unreliable throughout the case.  All pedicle screws showed no adverse EMG activity when directly stimulated greater than 40 mA.  Operative report  Patient is brought the operating room placed upon the operating room table.  After successful induction of general anesthesia endotracheal intubation teds SCDs and a Foley were inserted.  Patient was then turned prone onto the Wilson frame and all bony prominences well-padded.  The back was then prepped and draped in a standard fashion.  Timeout was taken to confirm patient procedure and all other important data.  On the left-hand side identified the lateral border of the L4 and L5 pedicles and then infiltrated the area with quarter percent Marcaine with epinephrine.  2 small percutaneous incisions were made and I advanced the Jamshidi needle down to the lateral aspect of the facet complex.  I then advanced the Jamshidi needle into the L4 pedicle.  I used fluoroscopic guidance as well as neural stimulation.  As the Jamshidi needle was advanced and near the medial wall of the pedicle I switched to the lateral view to ensure that I was just beyond the posterior margin of the vertebral body.  Having confirmed trajectory I advanced into the vertebral body and then placed the guidepin through the Jamshidi needle to cannulate the pedicle.  This was repeated at L5.  With the left L4 and L5 pedicle cannulated I then proceeded to the right-hand side.  As this was the symptomatic  side I made a Wiltsie incision spanning the space between the L4 and L5 pedicle sharp dissection was carried out down to the deep fascia.  I incised the deep fascia and bluntly dissected through the paraspinal musculature until I could palpate the facet complex.  Using the same technique I placed the Jamshidi needle on the lateral aspect of facet and advanced using fluoroscopic guidance and neuro monitoring into the vertebral body.  This was repeated at L4.  Both pedicles were cannulated.  I then measured and placed the 6.5 x 45 mm screw at L4 and the 7.5 x 45 mm screw at L5.  Both screws had excellent purchase and were well seated.  The retractor system was then built off of the pedicle screw system and I visualized the posterior lateral aspect of the spine.  The medial retractor blade was placed and I could see the L4 lamina and the L4-5 facet as well as the L4 pars.  Electrocautery was used to remove the L4-5 facet complex and then I used the osteotome and Leksell rongeurs to completely remove the L4 inferior facet.  I then used a 3 mm Kerrison rongeurs to perform a generous laminotomy of L4.  I then used a Penfield 4 to dissect through the ligamentum flavum and exposed the underlying thecal sac.  2 and 3 mm Kerrison rongeurs were used to remove the ligamentum flavum.  Now I could get into the lateral recess and resect the medial aspect of the L5 superior facet complex.  Identified the L5 nerve root and tract into the foramen.  Foraminotomies was performed.  At this  point the L5 nerve root was free and no longer under compression.  I removed the superior portion of the L5 facet complex to completely expose the posterior lateral aspect of the L4-5 disc space.  This allowed me to remove the pars of L4.  I then exposed the very thickened fibrocartilaginous pars defect and dissected underneath it.  I created a plane between the L3 nerve root and this thickened area.  This was resected with a 2 and 3 mm Kerrison  punch to unroofed the L4 nerve root.  At this point had an adequate foraminotomy of L3-4 and decompression of that nerve root.  Neuro patties were placed medially and superiorly to protect the L4 nerve root and the thecal sac.  A third 1 was placed inferiorly to protect the L5 nerve root.  At this point the posterior lateral disc space was clearly visualized.  15 blade scalpel was used to perform a generous annulotomy and then I used pituitary rongeurs curettes and Kerrison rongeurs to remove the disc material.  Sidecutting curettes were used to cut across the disc space and remove it.  At this point I was able to palpate with a neuro hook in the disc space and confirmed that I had removal of the cartilaginous endplate and bleeding subchondral bone.  I then placed the trial devices and elected to use the size 11.  This provided the best fit.  I then placed the conformer sheet along the anterior annulus and then packed the cage with autograft harvested from the decompression along with DBX.  I then malleted the cage into the intervertebral space.  As it was more vertical I then realigned it so came to rest horizontally in the anterior aspect of the disc space.  It was slightly angulated but we had good contact with the endplates.  At this point I removed the kyphosis that had built up into the [place] frame.  With the intervertebral cage properly positioned I placed the remaining 2 pedicle screws on the left side which were both 6.5 x 45.  With all 4 pedicle screws in place I measured and placed 40 mm length rods and locked into place.  All 4 locking knots were torqued off according manufacturer standards.  At this point all wounds were copiously irrigated with normal saline.  I did place a small amount of Depo-Medrol over the exposed L4 and L5 nerve root for postoperative analgesia.  Thrombin-soaked Gelfoam patty was placed over the exposed laminotomy site.  Hemostasis was obtained using bipolar cautery and  FloSeal prior to closure.  After final irrigation I closed the deep fascia with interrupted #1 Vicryl sutures, superficial 2-0 Vicryl suture, and the skin with 3-0 Monocryl stitches.  Steri-Strips dry dressings were applied the patient was ultimately extubated transfer the PACU that incident.  The end of the case all needle sponge counts were correct.  There were no adverse intraoperative events.

## 2018-05-11 NOTE — H&P (Signed)
History:  Tami Turner is a very pleasant 58 year old woman with significant back buttock and neuropathic right leg pain.  Imaging studies demonstrate to 1 degenerative spondylolisthesis with lateral recess and foraminal stenosis affecting both the exiting L4 and traversing L5 nerve root.  Despite appropriate conservative management her quality of life continues to suffer.  She has sensory deficits as well as neuropathic pain which is adversely impairing her life.  As result of the failure of conservative management, and the progressive debilitating nature of her pain we have elected to move forward with surgery.  Surgical plan is a posterior-based transforaminal lumbar interbody fusion at L4-5.  This will allow for removal of the offending facet joint and allow decompression of the exiting L4 and traversing L5 nerve root and stabilization of the spondylolisthesis.  Patient has gotten preoperative medical clearance from her primary care physician and we have reviewed the risks benefits and alternatives to surgery as well as the goals and expectations.  Allergies AMITRIPTYLINE  DIVALPROEX SODIUM  DULAGLUTIDE  GABAPENTIN: Other (Severe)  TOPIRAMATE  TRULICITY: Nausea (Severe)   Medications Advair Diskus 250 mcg-50 mcg/dose powder for inhalation Aspirin Low Dose B-comp-C-iron-FA-docusate Na biotin cannabidiol (CBD) oral oil clonazePAM 2 mg tablet curcumin (bulk) 100 % powder cyclobenzaprine 10 mg tablet D3 DOTS fluticasone propionate 50 mcg/actuation nasal spray,suspension glimepiride 4 mg tablet lidocaine 5 % topical patch metFORMIN ER 500 mg tablet,extended release 24 hr omeprazole 20 mg capsule,delayed release pregabalin 50 mg capsule red yeast rice 600 mg capsule Tresiba FlexTouch U-100 insulin 100 unit/mL (3 mL) subcutaneous pen venlafaxine ER 75 mg capsule,extended release 24 hr Womens Multivitamin Hi Potency  Problems Degeneration of cervical intervertebral disc - Onset:  01/17/2018 Degeneration of lumbar intervertebral disc - Onset: 03/09/2018 Pain in cervical spine - Onset: 03/08/2018 Pain in lumbar spine - Onset: 02/24/2018 Neck pain - Onset: 01/02/2018  Family History Mother - Arthritis Father - Arthritis   - Diabetes mellitus  Social History Tobacco Smoking Status: Current every day smoker Smoker (1/2 PPD) Tobacco-years of use: 20 Alcohol intake: Occasional  Surgical History Colonoscopy Hysterectomy Other Tonsillectomy Tubal ligation  Past Medical History Anxiety Disorder: Y Asthma: Y Cancer: Y Colitis/Stomach Ulcers: Y Depression: Y Diabetes: Y Excessive Thirst: Y Fibromyalgia: Y Headaches: Y Joint Pain: Y MRSA: Y Sleep apnea: Y Thyroid Problems/Goiter: Y  Physical Exam General: AAOX3, well developed and well nourished, NAD Heart: RRR, nbo rubs, murmers, or gallops  Lungs: CTAB  Abdomen: Normal bowel soundsX4, soft, non-tender, no hepatosplenomegaly.   ROM: -Spine: normal ROM, pain elicited with fwd flexion and extension.  - Knee: flexion and extension normal and pain free bilaterally.  - Ankle: Dorsiflexion, plantarflexion, inversion, eversion normal and pain free.   Dermatomes: Lower extremity sensation to light touch abnormal with positive dysathesias on the right.  Myotomes:   - Hip Flexion: Left 5/5, Right 5/5  -Hip Adduction: Left 5/5, Right 5/5  - Knee Extension: Left 5/5, Right 4/5  - Knee Flexion: Left 5/5, Right 5/5  - Ankle Dorsiflextion: Left 5/5, Right 4/5  - Ankle Eversion: Left 5/5, Right 5/5  - Ankle Plantarflexion: Left 5/5, Right 5/5  Reflexes:   - Patella: Left2+, Right 2+  - Achilles: Left2+, Right 2+  - Babinski: Left Ngative, Right Negative  - Clonus: Negative  Special Tests:  - Straight Leg Raise: Left Negative, Right Positive  PV: Extremities warm and well profused. Posterior and dorsalis pedis pulse 2+ bilaterally, No pitting Edema, discoloration, calf tenderness, or  palpable cords. Homan's negative bilaterally.  Lumbar MRI: completed on 03/01/18 was reviewed with the patient. I have also reviewed the radiology report. Grade 1 anterolisthesis at L4-5 with severe facet arthrosis right worse than the left. This is producing marked right foraminal stenosis with compression of the traversing L5 and exiting L4 nerve root.  Assessment & Plan Tami Turner is a pleasant 58 year old female scheduled for a TLIF at the L4-5 level on 05-11-18 with Dr. Rolena Infante. Clinical indications for the surgery include right quad 4 out of 5 and right tibialis anterior and flexor hallucis 4 out of 5,Positive right leg dysesthesias consistent with that L4-5 dermatome pattern. These motor deficits and dermatome dysesthesias are consistent with the x-ray that shows a grade 1 anterolisthesis at the L4-5 level, Facet arthrosis right greater than left, Right foraminal stenosis and overall compression L4-L5 nerve roots. Overall her symptoms have failed to improve with conservative measurements such as pain management medications and injections as well as physical therapy.   We have gone over the surgical procedure which will be a transforaminal lumbar interbody fusion L4-5. All of her questions were encouraged and addressed.  Goal of surgery is to reduce not eliminate her pain and improve her quality-of-life.  Risks and benefits of surgery were discussed with the patient. These include: Infection, bleeding, death, stroke, paralysis, ongoing or worse pain, need for additional surgery, nonunion, leak of spinal fluid, adjacent segment degeneration requiring additional fusion surgery, Injury to abdominal vessels that can require anterior surgery to stop bleeding. Malposition of the cage and/or pedicle screws that could require additional surgery. Loss of bowel and bladder control. Postoperative hematoma causing neurologic compression that could require urgent or emergent re-operation.

## 2018-05-11 NOTE — Brief Op Note (Signed)
05/11/2018  11:23 AM  PATIENT:  Tami Turner  58 y.o. female  PRE-OPERATIVE DIAGNOSIS:  L4-5 Degenerative slip with foraminal stenosis, right L4-5 radiculopathy  POST-OPERATIVE DIAGNOSIS:  L4-5 Degenerative slip with foraminal stenosis, right L4-5 radiculopathy  PROCEDURE:  Procedure(s): TRANSFORAMINAL LUMBAR INTERBODY FUSION (TLIF) L4-5 (N/A)  SURGEON:  Surgeon(s) and Role:    Melina Schools, MD - Primary  PHYSICIAN ASSISTANT:   ASSISTANTS: none   ANESTHESIA:   general  EBL:  200 mL   BLOOD ADMINISTERED:none  DRAINS: none   LOCAL MEDICATIONS USED:  MARCAINE    and OTHER depomedrol  SPECIMEN:  No Specimen  DISPOSITION OF SPECIMEN:  N/A  COUNTS:  YES  TOURNIQUET:  * No tourniquets in log *  DICTATION: .Dragon Dictation  PLAN OF CARE: Admit to inpatient   PATIENT DISPOSITION:  PACU - hemodynamically stable.

## 2018-05-12 LAB — GLUCOSE, CAPILLARY
Glucose-Capillary: 111 mg/dL — ABNORMAL HIGH (ref 70–99)
Glucose-Capillary: 128 mg/dL — ABNORMAL HIGH (ref 70–99)
Glucose-Capillary: 106 mg/dL — ABNORMAL HIGH (ref 70–99)

## 2018-05-12 MED ORDER — OXYCODONE-ACETAMINOPHEN 10-325 MG PO TABS: 1.0000 | ORAL_TABLET | Freq: Four times a day (QID) | ORAL | 0 refills | Status: AC | PRN

## 2018-05-12 MED ORDER — MAGNESIUM CITRATE PO SOLN
1.0000 | Freq: Once | ORAL | Status: AC
  Administered 2018-05-12: 1 via ORAL
  Filled 2018-05-12: qty 296

## 2018-05-12 MED ORDER — NALOXEGOL OXALATE 25 MG PO TABS
25.0000 mg | ORAL_TABLET | Freq: Every day | ORAL | Status: DC
  Administered 2018-05-12: 25 mg via ORAL
  Filled 2018-05-12 (×2): qty 1

## 2018-05-12 MED ORDER — ONDANSETRON HCL 4 MG PO TABS: 4.0000 mg | ORAL_TABLET | Freq: Three times a day (TID) | ORAL | 0 refills | Status: AC | PRN

## 2018-05-12 MED FILL — Thrombin (Recombinant) For Soln 20000 Unit: CUTANEOUS | Qty: 1 | Status: AC

## 2018-05-12 NOTE — Progress Notes (Signed)
Pt wheeled down for discharge; alert, conversant, in no acute distress or complaints of pain; all belongings securely handed to her; discharge instructions given and verbalized understanding.

## 2018-05-12 NOTE — Progress Notes (Signed)
OT Evaluation/Discharge  All education regarding back precautions for ADL and functional mobility for ADL. Pt demonstrated understanding.    05/12/18 1000  OT Visit Information  Last OT Received On 05/12/18  Assistance Needed +1  History of Present Illness Pt is a 58 y/o female s/p TLIF L4-L5. PMH including but not limited to HTN, DM, neuropathy and fibromyalgia.  Precautions  Precautions Back  Precaution Booklet Issued Yes (comment)  Precaution Comments pt able to recall 3/3 back precautions independently  Required Braces or Orthoses Spinal Brace  Spinal Brace Lumbar corset;Applied in sitting position  Restrictions  Weight Bearing Restrictions No  Home Living  Family/patient expects to be discharged to: Private residence  Living Arrangements Alone  Available Help at Discharge Available 24 hours/day;Friend(s)  Type of [place] Access Level entry  Home Layout One level  Bathroom Shower/Tub Biochemist, clinical Yes  How Accessible Accessible via walker  [place] - 4 wheels;Shower seat;Hand held shower head  Prior Function  Level of Independence Independent  Communication  Communication No difficulties  Pain Assessment  Pain Assessment 0-10  Pain Score 5  Pain Location low back  Pain Descriptors / Indicators Sore  Pain Intervention(s) Limited activity within patient's tolerance  Cognition  Arousal/Alertness Awake/alert  Behavior During Therapy WFL for tasks assessed/performed  Overall Cognitive Status Within Functional Limits for tasks assessed  Upper Extremity Assessment  Upper Extremity Assessment Overall WFL for tasks assessed (Has fibromyalgia pain per self report)  Lower Extremity Assessment  Lower Extremity Assessment Defer to PT evaluation  Cervical / Trunk Assessment  Cervical / Trunk Assessment Other exceptions  Cervical / Trunk Exceptions s/p lumbar sx  ADL  Overall ADL's  Needs  assistance/impaired  Functional mobility during ADLs Modified independent;Rolling walker  General ADL Comments Educated pt on back precautions for ADL adn mobility. Pt able to complete figure four positioning for LB ADL; able to donn/doff brace independently. able to verbalize techniques to use for bathing and grooming and reducaing risk of falls. Educated on proper home set up to increase independence. Pt states she will have friends to help her out as needed. Educated pt on recommendation to purchase a reacher and to use a 3in1 over her toilet.   Vision- History  Baseline Vision/History Wears glasses  Bed Mobility  General bed mobility comments pt seated OOB in recliner chair upon arrival; educated pt on proper technique for bed mobility using handout. Pt verbalized understanidng.   Transfers  Overall transfer level Needs assistance  Equipment used None  Transfers Sit to/from Stand  Sit to Stand Modified independent (Device/Increase time)  Balance  Overall balance assessment Needs assistance  Sitting-balance support Feet supported  Sitting balance-Leahy Scale Good  Standing balance support During functional activity;No upper extremity supported  Standing balance-Leahy Scale Good  OT - End of Session  Equipment Utilized During Treatment Back brace  Activity Tolerance Patient tolerated treatment well  Patient left in chair;with call bell/phone within reach  Nurse Communication Mobility status;Other (comment) (DC needs)  OT Assessment  OT Recommendation/Assessment Patient does not need any further OT services  OT Visit Diagnosis Pain  Pain - part of body  (back)  OT Problem List Decreased activity tolerance;Decreased safety awareness;Decreased knowledge of use of DME or AE;Decreased knowledge of precautions;Obesity;Pain  AM-PAC OT "6 Clicks" Daily Activity Outcome Measure (Version 2)  Help from another person eating meals? 4  Help from another person taking care of personal grooming? 4  Help from another person toileting, which includes using toliet, bedpan, or urinal? 3  Help from another person bathing (including washing, rinsing, drying)? 3  Help from another person to put on and taking off regular upper body clothing? 4  Help from another person to put on and taking off regular lower body clothing? 3  6 Click Score 21  OT Recommendation  Follow Up Recommendations No OT follow up;Supervision - Intermittent  OT Equipment 3 in 1 bedside commode  Acute Rehab OT Goals  Patient Stated Goal decrease pain, eventually return to riding her bicycle  OT Goal Formulation All assessment and education complete, DC therapy  OT Time Calculation  OT Start Time (ACUTE ONLY) 0951  OT Stop Time (ACUTE ONLY) 1014  OT Time Calculation (min) 23 min  OT General Charges  $OT Visit 1 Visit  OT Evaluation  $OT Eval Low Complexity 1 Low  OT Treatments  $Self Care/Home Management  8-22 mins   Maurie Boettcher, OT/L   Acute OT Clinical Specialist [place] Pager [phone] Office [phone]

## 2018-05-12 NOTE — Evaluation (Signed)
Physical Therapy Evaluation Patient Details Name: Tami Turner MRN: [id number] DOB: 09-12-1960 Today's Date: 05/12/2018   History of Present Illness  Pt is a 58 y/o female s/p TLIF L4-L5. PMH including but not limited to HTN, DM, neuropathy and fibromyalgia.  Clinical Impression  Pt presented sitting OOB in recliner chair, awake and willing to participate in therapy session. Prior to admission, pt reported that shew as independent with all functional mobility and ADLs. Pt lives alone in an apartment with a level entry. She will have 24/7 supervision/assistance from friends upon d/c. Pt currently requires min guard to supervision level overall for functional mobility including transfers and ambulation in hallway with RW. Pt generally steady, cautious and guarded with ambulation using RW. PT reviewed generalized walking program for pt to initiate upon d/c home. PT also provided pt with back precautions handout and reviewed with pt throughout. PT will continue to follow acutely to progress mobility as tolerated.     Follow Up Recommendations No PT follow up;Supervision for mobility/OOB    Equipment Recommendations  None recommended by PT    Recommendations for Other Services       Precautions / Restrictions Precautions Precautions: Back Precaution Booklet Issued: Yes (comment) Precaution Comments: pt able to recall 3/3 back precautions independently Required Braces or Orthoses: Spinal Brace Spinal Brace: Lumbar corset;Applied in sitting position Restrictions Weight Bearing Restrictions: No      Mobility  Bed Mobility               General bed mobility comments: pt seated OOB in recliner chair upon arrival  Transfers Overall transfer level: Needs assistance Equipment used: None Transfers: Sit to/from Stand Sit to Stand: Min guard         General transfer comment: increased time and effort, min guard for safety with transition  Ambulation/Gait Ambulation/Gait  assistance: Min guard;Supervision Gait Distance (Feet): 500 Feet Assistive device: Rolling walker (2 wheeled) Gait Pattern/deviations: Step-through pattern;Decreased stride length Gait velocity: decreased   General Gait Details: pt steady overall with RW, initially cautious and guarded, progressing from min guard initially to supervision for safety; no LOB or need for physical assistance  Stairs            Wheelchair Mobility    Modified Rankin (Stroke Patients Only)       Balance Overall balance assessment: Needs assistance Sitting-balance support: Feet supported Sitting balance-Leahy Scale: Good     Standing balance support: During functional activity;No upper extremity supported Standing balance-Leahy Scale: Fair                               Pertinent Vitals/Pain Pain Assessment: 0-10 Pain Score: 5  Pain Location: low back Pain Descriptors / Indicators: Sore Pain Intervention(s): Monitored during session    Home Living Family/patient expects to be discharged to:: Private residence Living Arrangements: Alone Available Help at Discharge: Available 24 hours/day;Friend(s) Type of Home: Apartment Home Access: Level entry     Home Layout: One level Home Equipment: Walker - 4 wheels;Shower seat;Hand held shower head      Prior Function Level of Independence: Independent               Hand Dominance        Extremity/Trunk Assessment   Upper Extremity Assessment Upper Extremity Assessment: Defer to OT evaluation;Overall [facility] for tasks assessed    Lower Extremity Assessment Lower Extremity Assessment: Overall WFL for tasks assessed  Cervical / Trunk Assessment Cervical / Trunk Assessment: Other exceptions Cervical / Trunk Exceptions: s/p lumbar sx  Communication   Communication: No difficulties  Cognition Arousal/Alertness: Awake/alert Behavior During Therapy: WFL for tasks assessed/performed Overall Cognitive Status: Within  Functional Limits for tasks assessed                                        General Comments      Exercises     Assessment/Plan    PT Assessment Patient needs continued PT services  PT Problem List Decreased strength;Decreased balance;Decreased mobility;Decreased coordination;Decreased knowledge of use of DME;Decreased safety awareness;Decreased knowledge of precautions;Pain       PT Treatment Interventions DME instruction;Gait training;Stair training;Functional mobility training;Therapeutic activities;Therapeutic exercise;Balance training;Neuromuscular re-education;Patient/family education    PT Goals (Current goals can be found in the Care Plan section)  Acute Rehab PT Goals Patient Stated Goal: decrease pain, eventually return to riding her bicycle PT Goal Formulation: With patient Time For Goal Achievement: 05/19/18 Potential to Achieve Goals: Good    Frequency Min 5X/week   Barriers to discharge        Co-evaluation               AM-PAC PT "6 Clicks" Mobility  Outcome Measure Help needed turning from your back to your side while in a flat bed without using bedrails?: A Little Help needed moving from lying on your back to sitting on the side of a flat bed without using bedrails?: A Little Help needed moving to and from a bed to a chair (including a wheelchair)?: None Help needed standing up from a chair using your arms (e.g., wheelchair or bedside chair)?: None Help needed to walk in hospital room?: None Help needed climbing 3-5 steps with a railing? : A Little 6 Click Score: 21    End of Session Equipment Utilized During Treatment: Gait belt;Back brace Activity Tolerance: Patient tolerated treatment well Patient left: in chair;with call bell/phone within reach Nurse Communication: Mobility status PT Visit Diagnosis: Other abnormalities of gait and mobility (R26.89);Pain Pain - part of body: (back)    Time: 5643-3295 PT Time Calculation  (min) (ACUTE ONLY): 29 min   Charges:   PT Evaluation $PT Eval Moderate Complexity: 1 Mod PT Treatments $Gait Training: 8-22 mins        Sherie Don, [place], DPT  Acute Rehabilitation Services Pager [phone] Office [place] 05/12/2018, 9:56 AM

## 2018-05-12 NOTE — Progress Notes (Signed)
    Subjective: Procedure(s) (LRB): TRANSFORAMINAL LUMBAR INTERBODY FUSION (TLIF) L4-5 (N/A) 1 Day Post-Op  Patient reports pain as 0 on 0-10 scale and 2 on 0-10 scale.  Reports decreased leg pain reports incisional back pain   Positive void -should notes difficulty initiating her urine stream.  She does note that she has underlying bladder issues.  These include noncompliant bladder wall, and a history of urgency. Negative bowel movement Positive flatus Negative chest pain or shortness of breath  Objective: Vital signs in last 24 hours: Temp:  [98.2 F (36.8 C)-98.7 F (37.1 C)] 98.7 F (37.1 C) (01/24 1207) Pulse Rate:  [78-90] 80 (01/24 1207) Resp:  [16-20] 16 (01/24 1207) BP: (113-146)/(67-88) 113/73 (01/24 1207) SpO2:  [96 %-100 %] 96 % (01/24 1207) Weight:  [96.4 kg] 96.4 kg (01/23 1307)  Intake/Output from previous day: 01/23 0701 - 01/24 0700 In: 1832.4 [P.O.:220; I.V.:1612.4] Out: 1675 [Urine:1475; Blood:200]  Labs: No results for input(s): WBC, RBC, HCT, PLT in the last 72 hours. No results for input(s): NA, K, CL, CO2, BUN, CREATININE, GLUCOSE, CALCIUM in the last 72 hours. No results for input(s): LABPT, INR in the last 72 hours.  Physical Exam: Neurologically intact Intact pulses distally Dorsiflexion/Plantar flexion intact Incision: dressing C/D/I Compartment soft Body mass index is 33.29 kg/m.   Assessment/Plan: Patient stable  xrays n/a  Continue mobilization with physical therapy Continue care  Advance diet Up with therapy  1.  We will continue mobilization with assistance as needed. 2.  Monitor urinary output.  She is voiding but she states is just not a significant amount.  She denies any true incontinence or saddle dysesthesias. 3.  Pain is currently well controlled.  She states the severe preoperative right radicular leg pain has resolved. 4.  Plan on discharge to home tomorrow provided she is voiding spontaneously in has a positive bowel  movement.  Melina Schools, MD [facility] [phone]

## 2018-05-15 ENCOUNTER — Encounter (HOSPITAL_COMMUNITY): Payer: Self-pay | Admitting: Orthopedic Surgery

## 2018-05-25 NOTE — Discharge Summary (Signed)
Patient ID: Tami Turner MRN: [id number] DOB/AGE: 09-14-1960 58 y.o.  Admit date: 05/11/2018 Discharge date: 05/25/2018  Admission Diagnoses:  Active Problems:   Degenerative disc disease, lumbar   Discharge Diagnoses:  Active Problems:   Degenerative disc disease, lumbar  status post Procedure(s): TRANSFORAMINAL LUMBAR INTERBODY FUSION (TLIF) L4-5  Past Medical History:  Diagnosis Date  . Anxiety   . Arthritis   . Asthma   . Central sleep apnea   . Chronic fatigue   . Chronic pain   . Complication of anesthesia    pt states she woke up X3 in past  . DDD (degenerative disc disease), cervical   . Depression   . Diabetes mellitus without complication ([place])   . Dry mouth   . Fatty liver   . Fibromyalgia   . 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 History of stomach ulcers   . Hypertension    no meds  . IBS (irritable bowel syndrome)   . Neuropathy   . Plantar fasciitis   . RLS (restless legs syndrome)     Surgeries: Procedure(s): TRANSFORAMINAL LUMBAR INTERBODY FUSION (TLIF) L4-5 on 05/11/2018   Consultants: None  Discharged Condition: Improved  Hospital Course: Tami Turner is an 58 y.o. female who was admitted 05/11/2018 for operative treatment of <principal problem not specified>. Patient failed conservative treatments (please see the history and physical for the specifics) and had severe unremitting pain that affects sleep, daily activities and work/hobbies. After pre-op clearance, the patient was taken to the operating room on 05/11/2018 and underwent  Procedure(s): TRANSFORAMINAL LUMBAR INTERBODY FUSION (TLIF) L4-5.    Patient was given perioperative antibiotics:  Anti-infectives (From admission, onward)   Start     Dose/Rate Route Frequency Ordered Stop   05/11/18 1600  ceFAZolin (ANCEF) IVPB 1 g/50 mL premix     1 g 100 mL/hr over 30 Minutes Intravenous Every 8 hours 05/11/18 1257 05/12/18 0820   05/11/18 0644  ceFAZolin (ANCEF) IVPB  2g/100 mL premix     2 g 200 mL/hr over 30 Minutes Intravenous 30 min pre-op 05/11/18 0644 05/11/18 0732       Patient was given sequential compression devices and early ambulation to prevent DVT.   Patient benefited maximally from hospital stay and there were no complications. At the time of discharge, the patient was urinating/moving their bowels without difficulty, tolerating a regular diet, pain is controlled with oral pain medications and they have been cleared by PT/OT.   Recent vital signs: No data found.   Recent laboratory studies: No results for input(s): WBC, HGB, HCT, PLT, NA, K, CL, CO2, BUN, CREATININE, GLUCOSE, INR, CALCIUM in the last 72 hours.  Invalid input(s): PT, 2   Discharge Medications:   Allergies as of 05/12/2018      Reactions   Topiramate Other (See Comments)   Affects neurological function   Amitriptyline Other (See Comments)   Psychosis- per patient   Exenatide Other (See Comments)   Intestinal pain   Gabapentin Other (See Comments)   Muscle spasms   Oxycodone-acetaminophen Other (See Comments)   Increased depression   Trulicity [dulaglutide] Other (See Comments)   Unknown   Valproic Acid Other (See Comments)   "Negative mental reactions" - per pt   Sulfa Antibiotics Nausea And Vomiting      Medication List    STOP taking these medications   aspirin 81 MG tablet     TAKE these medications   albuterol 108 (90  Base) MCG/ACT inhaler Commonly known as:  PROVENTIL HFA;VENTOLIN HFA Inhale 1-2 puffs into the lungs every 6 (six) hours as needed for wheezing or shortness of breath.   ALPRAZolam 1 MG tablet Commonly known as:  XANAX Take 1 mg by mouth daily as needed for anxiety.   ASHWAGANDHA PO Take 1.25 mLs by mouth daily.   b complex vitamins tablet Take 1 tablet by mouth 2 (two) times daily.   Biotin 10 MG Caps Take 10 mg by mouth daily.   clonazePAM 2 MG tablet Commonly known as:  KLONOPIN Take 2 mg by mouth 2 (two) times daily.    FLEXERIL 10 MG tablet Generic drug:  cyclobenzaprine Take 10 mg by mouth See admin instructions. Take 10 mg by mouth twice daily. May take additional 10 mg by mouth if needed for muscle spasms   fluticasone 50 MCG/ACT nasal spray Commonly known as:  FLONASE Place 1 spray into both nostrils at bedtime.   Fluticasone-Salmeterol 250-50 MCG/DOSE Aepb Commonly known as:  ADVAIR Inhale 1 puff into the lungs daily.   glimepiride 4 MG tablet Commonly known as:  AMARYL Take 4 mg by mouth 2 (two) times daily.   lidocaine 5 % Commonly known as:  LIDODERM Place 1 patch onto the skin daily. Remove & Discard patch within 12 hours or as directed by MD   LYRICA 50 MG capsule Generic drug:  pregabalin Take 50 mg by mouth 2 (two) times daily.   metFORMIN 500 MG 24 hr tablet Commonly known as:  GLUCOPHAGE-XR Take 1,000 mg by mouth 2 (two) times daily.   omeprazole 20 MG capsule Commonly known as:  PRILOSEC Take 20 mg by mouth every evening.   ondansetron 4 MG tablet Commonly known as:  ZOFRAN Take 1 tablet (4 mg total) by mouth every 8 (eight) hours as needed for nausea or vomiting.   ONE-A-DAY WOMENS PO Take 1 tablet by mouth daily.   OVER THE COUNTER MEDICATION Take 2.5 mLs by mouth daily. Neem Supplement Anti-Fungal   OVER THE COUNTER MEDICATION Take 2.5 mLs by mouth daily. Mucunapruriens Supplement   Red Yeast Rice 600 MG Caps Take 1,200 mg by mouth 2 (two) times daily.   TRESIBA FLEXTOUCH 100 UNIT/ML Sopn FlexTouch Pen Generic drug:  insulin degludec Inject 25 Units into the skin at bedtime.   TURMERIC PO Take 2.5 mLs by mouth daily.   venlafaxine XR 75 MG 24 hr capsule Commonly known as:  EFFEXOR-XR Take 225 mg by mouth at bedtime.   Vitamin D3 50 MCG (2000 UT) Tabs Take 2,000 Units by mouth daily.     ASK your doctor about these medications   oxyCODONE-acetaminophen 10-325 MG tablet Commonly known as:  PERCOCET Take 1 tablet by mouth every 6 (six) hours as  needed for up to 5 days for pain. Ask about: Should I take this medication?       Diagnostic Studies: Dg Lumbar Spine 2-3 Views  Result Date: 05/11/2018 CLINICAL DATA:  L4-5 TLIF. FLUOROSCOPY TIME:  2 minutes and 56 seconds. Images: 2 EXAM: DG C-ARM 61-120 MIN COMPARISON:  None. FINDINGS: The patient is status post pedicle rod and screw placement at L4 and L5 with a disc spacer device at the same level. IMPRESSION: Surgery at L4-5 with hardware placement as above. Electronically Signed   By: Dorise Bullion III M.D   On: 05/11/2018 11:11   Dg C-arm 1-60 Min  Result Date: 05/11/2018 CLINICAL DATA:  L4-5 TLIF. FLUOROSCOPY TIME:  2 minutes and  56 seconds. Images: 2 EXAM: DG C-ARM 61-120 MIN COMPARISON:  None. FINDINGS: The patient is status post pedicle rod and screw placement at L4 and L5 with a disc spacer device at the same level. IMPRESSION: Surgery at L4-5 with hardware placement as above. Electronically Signed   By: Dorise Bullion III M.D   On: 05/11/2018 11:11    Discharge Instructions    Incentive spirometry RT   Complete by:  As directed       Follow-up Information    Melina Schools, MD. Schedule an appointment as soon as possible for a visit in 2 week(s).   Specialty:  Orthopedic Surgery Contact information: [address] [place] [address] [phone]           Discharge Plan:  discharge to home  Disposition: Overall, patient is doing exceptionally well. She is voiding, passing gas, tolerating PO. Her radicular leg pain has improved and she has no progressing neurological deficits. Her pain is well managed with Po medications. She has been cleared by PT/OT for a safe discharge to home. She expressed an understanding of her discharge instructions. She will follow-up in office in two weeks.     Signed: Yvonne Kendall Ward for [facility] PA-C [facility] [phone] 05/25/2018, 10:48 AM

## 2018-07-25 ENCOUNTER — Other Ambulatory Visit: Payer: Self-pay | Admitting: Endocrinology

## 2018-09-04 ENCOUNTER — Other Ambulatory Visit: Payer: Medicare Other

## 2018-11-10 ENCOUNTER — Other Ambulatory Visit: Payer: Medicare Other

## 2018-11-17 ENCOUNTER — Ambulatory Visit
Admission: RE | Admit: 2018-11-17 | Discharge: 2018-11-17 | Disposition: A | Discharge status: Home / Self Care | Payer: Medicare Other | Source: Ambulatory Visit | Attending: Endocrinology | Admitting: Endocrinology

## 2018-11-17 DIAGNOSIS — E041 Nontoxic single thyroid nodule: Secondary | ICD-10-CM

## 2019-06-05 DIAGNOSIS — F331 Major depressive disorder, recurrent, moderate: Secondary | ICD-10-CM | POA: Diagnosis not present

## 2019-06-05 DIAGNOSIS — F4001 Agoraphobia with panic disorder: Secondary | ICD-10-CM | POA: Diagnosis not present

## 2019-06-05 DIAGNOSIS — G4730 Sleep apnea, unspecified: Secondary | ICD-10-CM | POA: Diagnosis not present

## 2019-07-18 DIAGNOSIS — C719 Malignant neoplasm of brain, unspecified: Secondary | ICD-10-CM | POA: Diagnosis not present

## 2019-07-26 DIAGNOSIS — F331 Major depressive disorder, recurrent, moderate: Secondary | ICD-10-CM | POA: Diagnosis not present

## 2019-07-26 DIAGNOSIS — F4001 Agoraphobia with panic disorder: Secondary | ICD-10-CM | POA: Diagnosis not present

## 2019-08-05 DIAGNOSIS — Z794 Long term (current) use of insulin: Secondary | ICD-10-CM | POA: Diagnosis not present

## 2019-08-05 DIAGNOSIS — T24091A Burn of unspecified degree of multiple sites of right lower limb, except ankle and foot, initial encounter: Secondary | ICD-10-CM | POA: Diagnosis not present

## 2019-08-05 DIAGNOSIS — F3189 Other bipolar disorder: Secondary | ICD-10-CM | POA: Diagnosis not present

## 2019-08-05 DIAGNOSIS — E119 Type 2 diabetes mellitus without complications: Secondary | ICD-10-CM | POA: Diagnosis not present

## 2019-08-05 DIAGNOSIS — T24222A Burn of second degree of left knee, initial encounter: Secondary | ICD-10-CM | POA: Diagnosis not present

## 2019-08-05 DIAGNOSIS — F17210 Nicotine dependence, cigarettes, uncomplicated: Secondary | ICD-10-CM | POA: Diagnosis not present

## 2019-08-05 DIAGNOSIS — F418 Other specified anxiety disorders: Secondary | ICD-10-CM | POA: Diagnosis not present

## 2019-08-05 DIAGNOSIS — T24232A Burn of second degree of left lower leg, initial encounter: Secondary | ICD-10-CM | POA: Diagnosis not present

## 2019-08-05 DIAGNOSIS — Y999 Unspecified external cause status: Secondary | ICD-10-CM | POA: Diagnosis not present

## 2019-08-05 DIAGNOSIS — Z7984 Long term (current) use of oral hypoglycemic drugs: Secondary | ICD-10-CM | POA: Diagnosis not present

## 2019-08-05 DIAGNOSIS — T24092A Burn of unspecified degree of multiple sites of left lower limb, except ankle and foot, initial encounter: Secondary | ICD-10-CM | POA: Diagnosis not present

## 2019-08-05 DIAGNOSIS — T24221A Burn of second degree of right knee, initial encounter: Secondary | ICD-10-CM | POA: Diagnosis not present

## 2019-08-05 DIAGNOSIS — Z781 Physical restraint status: Secondary | ICD-10-CM | POA: Diagnosis not present

## 2019-08-05 DIAGNOSIS — I10 Essential (primary) hypertension: Secondary | ICD-10-CM | POA: Diagnosis not present

## 2019-08-05 DIAGNOSIS — T59811A Toxic effect of smoke, accidental (unintentional), initial encounter: Secondary | ICD-10-CM | POA: Diagnosis not present

## 2019-08-05 DIAGNOSIS — S61512A Laceration without foreign body of left wrist, initial encounter: Secondary | ICD-10-CM | POA: Diagnosis not present

## 2019-08-05 DIAGNOSIS — F312 Bipolar disorder, current episode manic severe with psychotic features: Secondary | ICD-10-CM | POA: Diagnosis not present

## 2019-08-05 DIAGNOSIS — R918 Other nonspecific abnormal finding of lung field: Secondary | ICD-10-CM | POA: Diagnosis not present

## 2019-08-05 DIAGNOSIS — Z20822 Contact with and (suspected) exposure to covid-19: Secondary | ICD-10-CM | POA: Diagnosis not present

## 2019-08-05 DIAGNOSIS — E1142 Type 2 diabetes mellitus with diabetic polyneuropathy: Secondary | ICD-10-CM | POA: Diagnosis not present

## 2019-08-05 DIAGNOSIS — T24212A Burn of second degree of left thigh, initial encounter: Secondary | ICD-10-CM | POA: Diagnosis not present

## 2019-08-05 DIAGNOSIS — J810 Acute pulmonary edema: Secondary | ICD-10-CM | POA: Diagnosis not present

## 2019-08-05 DIAGNOSIS — F2089 Other schizophrenia: Secondary | ICD-10-CM | POA: Diagnosis not present

## 2019-08-05 DIAGNOSIS — F29 Unspecified psychosis not due to a substance or known physiological condition: Secondary | ICD-10-CM | POA: Diagnosis not present

## 2019-08-05 DIAGNOSIS — E1140 Type 2 diabetes mellitus with diabetic neuropathy, unspecified: Secondary | ICD-10-CM | POA: Diagnosis not present

## 2019-08-05 DIAGNOSIS — Z23 Encounter for immunization: Secondary | ICD-10-CM | POA: Diagnosis not present

## 2019-08-05 DIAGNOSIS — F319 Bipolar disorder, unspecified: Secondary | ICD-10-CM | POA: Diagnosis not present

## 2019-08-05 DIAGNOSIS — E1165 Type 2 diabetes mellitus with hyperglycemia: Secondary | ICD-10-CM | POA: Diagnosis not present

## 2019-08-05 DIAGNOSIS — F411 Generalized anxiety disorder: Secondary | ICD-10-CM | POA: Diagnosis not present

## 2019-08-05 DIAGNOSIS — T24211A Burn of second degree of right thigh, initial encounter: Secondary | ICD-10-CM | POA: Diagnosis not present

## 2019-08-05 DIAGNOSIS — W268XXA Contact with other sharp object(s), not elsewhere classified, initial encounter: Secondary | ICD-10-CM | POA: Diagnosis not present

## 2019-08-05 DIAGNOSIS — T310 Burns involving less than 10% of body surface: Secondary | ICD-10-CM | POA: Diagnosis not present

## 2019-08-05 DIAGNOSIS — Z79899 Other long term (current) drug therapy: Secondary | ICD-10-CM | POA: Diagnosis not present

## 2019-08-05 DIAGNOSIS — R4182 Altered mental status, unspecified: Secondary | ICD-10-CM | POA: Diagnosis not present

## 2019-09-04 DIAGNOSIS — F333 Major depressive disorder, recurrent, severe with psychotic symptoms: Secondary | ICD-10-CM | POA: Diagnosis not present

## 2019-09-11 DIAGNOSIS — F333 Major depressive disorder, recurrent, severe with psychotic symptoms: Secondary | ICD-10-CM | POA: Diagnosis not present

## 2019-09-18 DIAGNOSIS — F333 Major depressive disorder, recurrent, severe with psychotic symptoms: Secondary | ICD-10-CM | POA: Diagnosis not present

## 2019-09-19 DIAGNOSIS — F314 Bipolar disorder, current episode depressed, severe, without psychotic features: Secondary | ICD-10-CM | POA: Diagnosis not present

## 2019-09-19 DIAGNOSIS — F411 Generalized anxiety disorder: Secondary | ICD-10-CM | POA: Diagnosis not present

## 2019-09-24 DIAGNOSIS — F314 Bipolar disorder, current episode depressed, severe, without psychotic features: Secondary | ICD-10-CM | POA: Diagnosis not present

## 2019-09-24 DIAGNOSIS — F411 Generalized anxiety disorder: Secondary | ICD-10-CM | POA: Diagnosis not present

## 2019-09-25 DIAGNOSIS — F333 Major depressive disorder, recurrent, severe with psychotic symptoms: Secondary | ICD-10-CM | POA: Diagnosis not present

## 2019-10-01 DIAGNOSIS — E039 Hypothyroidism, unspecified: Secondary | ICD-10-CM | POA: Diagnosis not present

## 2019-10-01 DIAGNOSIS — E785 Hyperlipidemia, unspecified: Secondary | ICD-10-CM | POA: Diagnosis not present

## 2019-10-01 DIAGNOSIS — I10 Essential (primary) hypertension: Secondary | ICD-10-CM | POA: Diagnosis not present

## 2019-10-01 DIAGNOSIS — F3160 Bipolar disorder, current episode mixed, unspecified: Secondary | ICD-10-CM | POA: Diagnosis not present

## 2019-10-01 DIAGNOSIS — E1142 Type 2 diabetes mellitus with diabetic polyneuropathy: Secondary | ICD-10-CM | POA: Diagnosis not present

## 2019-10-02 DIAGNOSIS — F411 Generalized anxiety disorder: Secondary | ICD-10-CM | POA: Diagnosis not present

## 2019-10-02 DIAGNOSIS — F314 Bipolar disorder, current episode depressed, severe, without psychotic features: Secondary | ICD-10-CM | POA: Diagnosis not present

## 2019-10-09 DIAGNOSIS — F333 Major depressive disorder, recurrent, severe with psychotic symptoms: Secondary | ICD-10-CM | POA: Diagnosis not present

## 2019-10-15 DIAGNOSIS — F314 Bipolar disorder, current episode depressed, severe, without psychotic features: Secondary | ICD-10-CM | POA: Diagnosis not present

## 2019-10-15 DIAGNOSIS — F411 Generalized anxiety disorder: Secondary | ICD-10-CM | POA: Diagnosis not present

## 2019-10-23 DIAGNOSIS — F333 Major depressive disorder, recurrent, severe with psychotic symptoms: Secondary | ICD-10-CM | POA: Diagnosis not present

## 2019-10-30 DIAGNOSIS — F314 Bipolar disorder, current episode depressed, severe, without psychotic features: Secondary | ICD-10-CM | POA: Diagnosis not present

## 2019-10-30 DIAGNOSIS — F411 Generalized anxiety disorder: Secondary | ICD-10-CM | POA: Diagnosis not present

## 2019-11-05 DIAGNOSIS — E119 Type 2 diabetes mellitus without complications: Secondary | ICD-10-CM | POA: Diagnosis not present

## 2019-11-05 DIAGNOSIS — H524 Presbyopia: Secondary | ICD-10-CM | POA: Diagnosis not present

## 2019-11-06 DIAGNOSIS — F314 Bipolar disorder, current episode depressed, severe, without psychotic features: Secondary | ICD-10-CM | POA: Diagnosis not present

## 2019-11-06 DIAGNOSIS — F411 Generalized anxiety disorder: Secondary | ICD-10-CM | POA: Diagnosis not present

## 2019-11-12 DIAGNOSIS — F314 Bipolar disorder, current episode depressed, severe, without psychotic features: Secondary | ICD-10-CM | POA: Diagnosis not present

## 2019-11-12 DIAGNOSIS — F411 Generalized anxiety disorder: Secondary | ICD-10-CM | POA: Diagnosis not present

## 2019-11-20 DIAGNOSIS — F314 Bipolar disorder, current episode depressed, severe, without psychotic features: Secondary | ICD-10-CM | POA: Diagnosis not present

## 2019-11-20 DIAGNOSIS — F411 Generalized anxiety disorder: Secondary | ICD-10-CM | POA: Diagnosis not present

## 2019-11-26 DIAGNOSIS — I10 Essential (primary) hypertension: Secondary | ICD-10-CM | POA: Diagnosis not present

## 2019-11-26 DIAGNOSIS — C717 Malignant neoplasm of brain stem: Secondary | ICD-10-CM | POA: Diagnosis not present

## 2019-11-26 DIAGNOSIS — E1142 Type 2 diabetes mellitus with diabetic polyneuropathy: Secondary | ICD-10-CM | POA: Diagnosis not present

## 2019-11-28 DIAGNOSIS — F411 Generalized anxiety disorder: Secondary | ICD-10-CM | POA: Diagnosis not present

## 2019-11-28 DIAGNOSIS — F314 Bipolar disorder, current episode depressed, severe, without psychotic features: Secondary | ICD-10-CM | POA: Diagnosis not present

## 2020-01-14 DIAGNOSIS — F314 Bipolar disorder, current episode depressed, severe, without psychotic features: Secondary | ICD-10-CM | POA: Diagnosis not present

## 2020-01-14 DIAGNOSIS — F411 Generalized anxiety disorder: Secondary | ICD-10-CM | POA: Diagnosis not present

## 2020-01-16 IMAGING — CR DG RIBS W/ CHEST 3+V*L*
5 series · 5 of 5 positions shown · non-contrast
Comparison: None.

CLINICAL DATA: Status post fall at home 2 days ago.

EXAM:
LEFT RIBS AND CHEST - 3+ VIEW

[w chest pa]
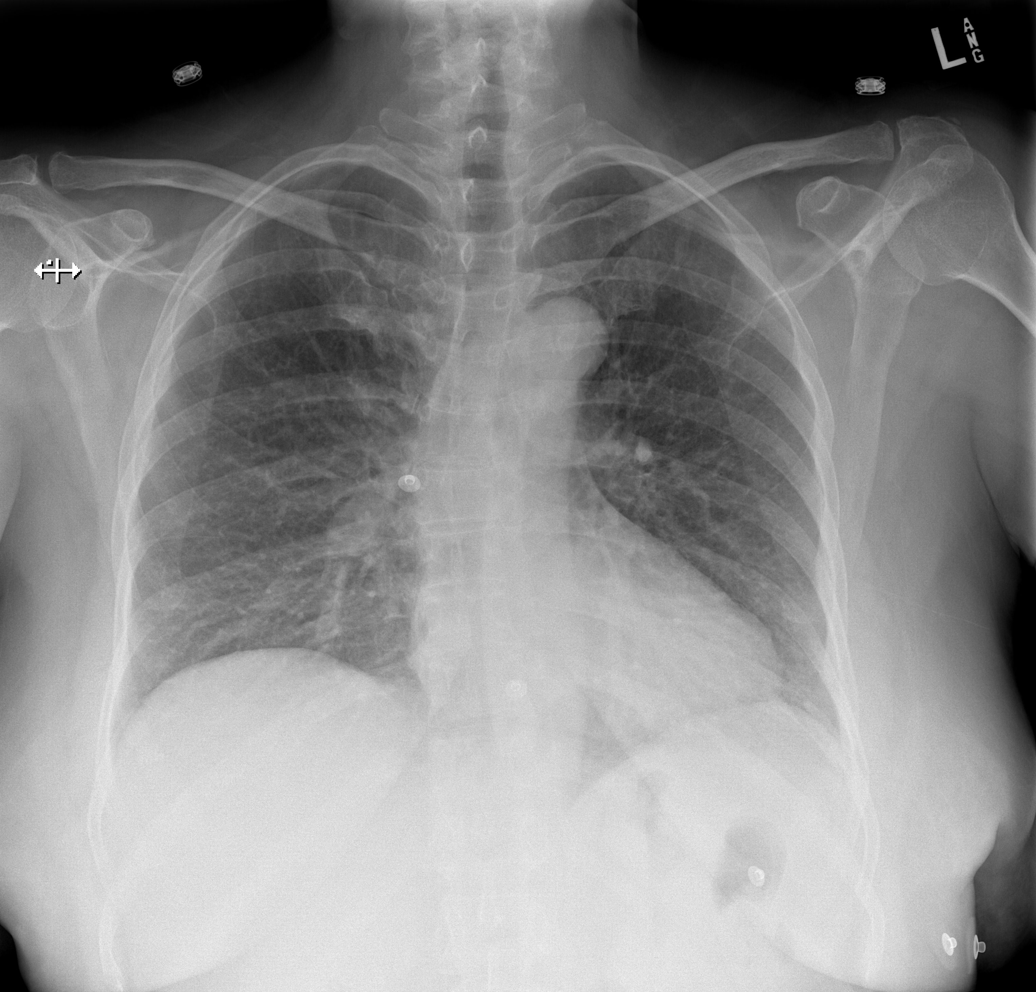

[w ribs ap upper left]
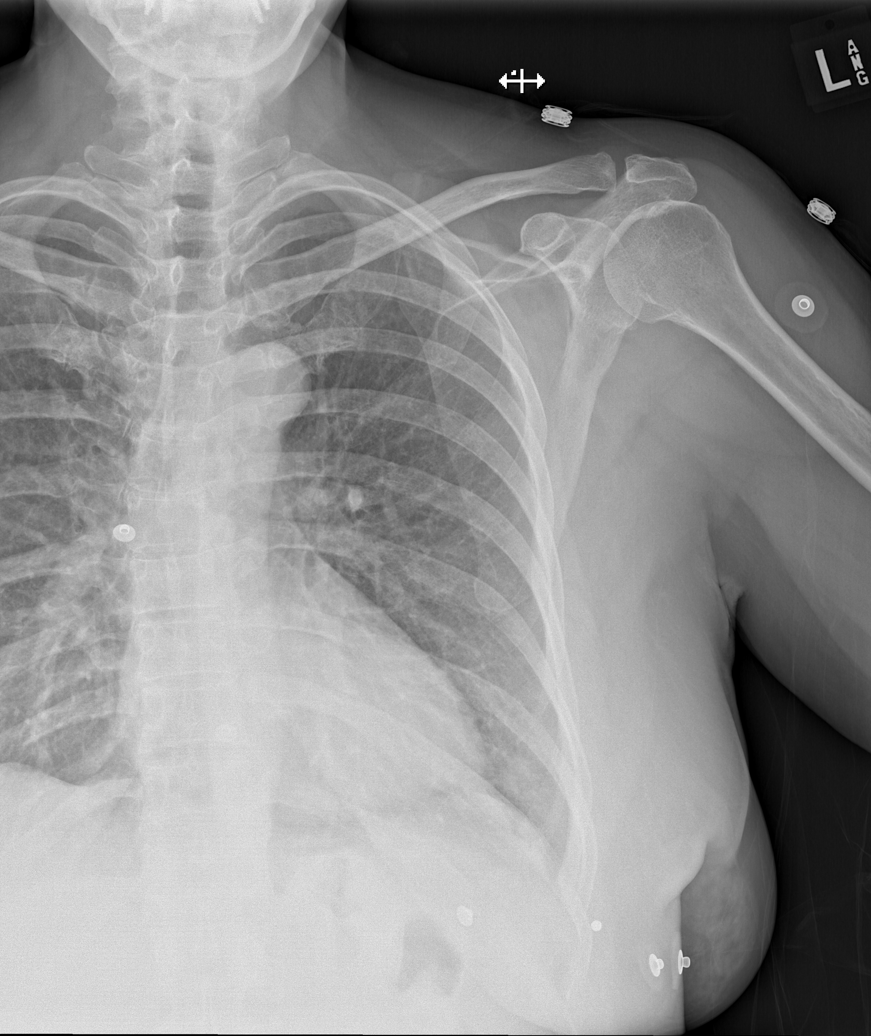

[w ribs ap lower left]
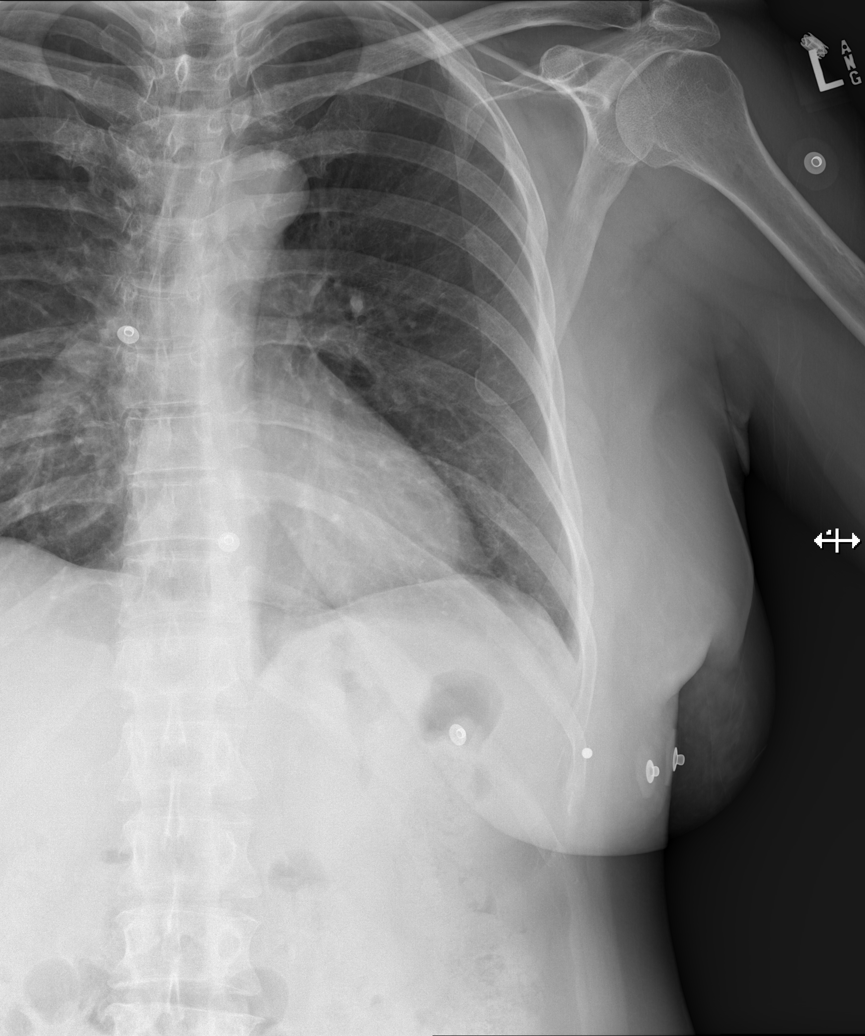

[w ribs obl left (1 of 2)]
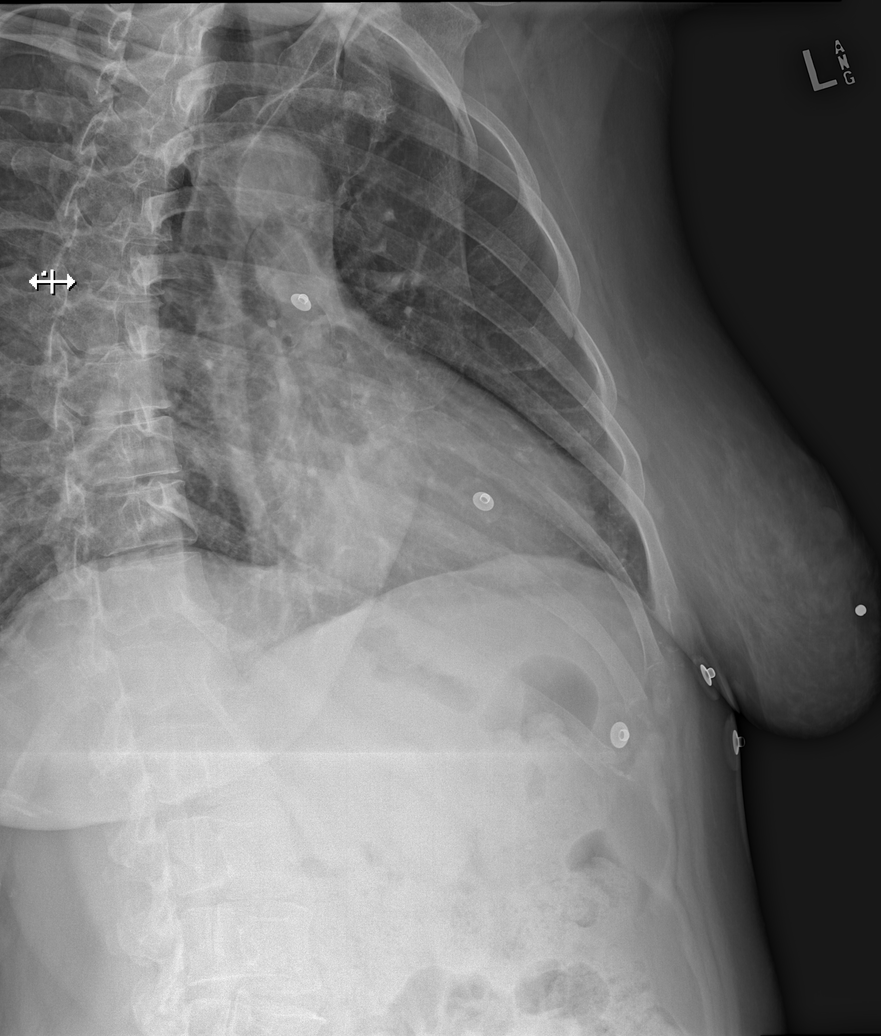

[w ribs obl left (2 of 2)]
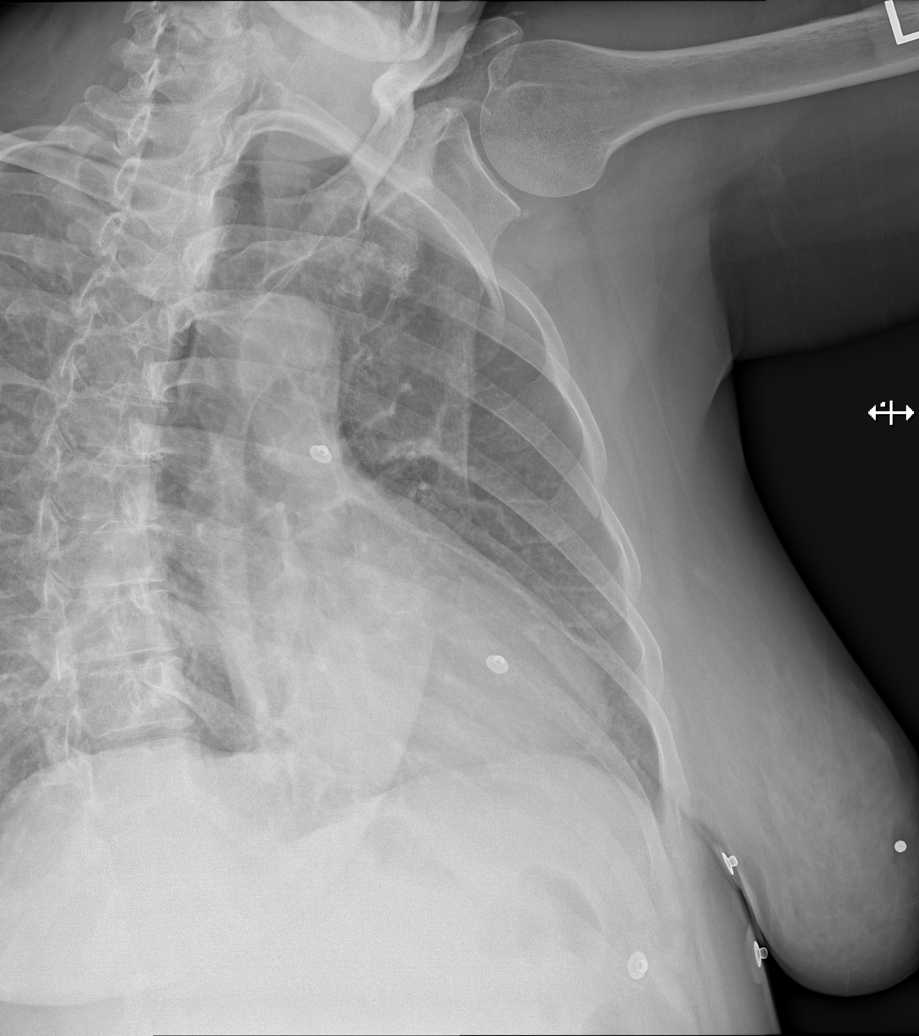

[5 of 5 positions shown; findings below may reference images not displayed]

FINDINGS: Acute nondisplaced fracture of the left tenth posterolateral rib.
Acute nondisplaced fracture of left posterolateral sixth rib. There
is no evidence of pneumothorax or pleural effusion. Both lungs are
clear. Heart size and mediastinal contours are within normal limits.
IMPRESSION: Acute nondisplaced fracture of the left tenth posterolateral rib.
Acute nondisplaced fracture of left posterolateral sixth rib.

## 2020-01-17 DIAGNOSIS — F314 Bipolar disorder, current episode depressed, severe, without psychotic features: Secondary | ICD-10-CM | POA: Diagnosis not present

## 2020-01-17 DIAGNOSIS — F411 Generalized anxiety disorder: Secondary | ICD-10-CM | POA: Diagnosis not present

## 2020-01-24 DIAGNOSIS — F314 Bipolar disorder, current episode depressed, severe, without psychotic features: Secondary | ICD-10-CM | POA: Diagnosis not present

## 2020-01-24 DIAGNOSIS — F411 Generalized anxiety disorder: Secondary | ICD-10-CM | POA: Diagnosis not present

## 2020-01-28 DIAGNOSIS — E1142 Type 2 diabetes mellitus with diabetic polyneuropathy: Secondary | ICD-10-CM | POA: Diagnosis not present

## 2020-01-31 DIAGNOSIS — F314 Bipolar disorder, current episode depressed, severe, without psychotic features: Secondary | ICD-10-CM | POA: Diagnosis not present

## 2020-01-31 DIAGNOSIS — F411 Generalized anxiety disorder: Secondary | ICD-10-CM | POA: Diagnosis not present

## 2020-02-01 DIAGNOSIS — I10 Essential (primary) hypertension: Secondary | ICD-10-CM | POA: Diagnosis not present

## 2020-02-01 DIAGNOSIS — Z23 Encounter for immunization: Secondary | ICD-10-CM | POA: Diagnosis not present

## 2020-02-01 DIAGNOSIS — E1142 Type 2 diabetes mellitus with diabetic polyneuropathy: Secondary | ICD-10-CM | POA: Diagnosis not present

## 2020-02-01 DIAGNOSIS — C717 Malignant neoplasm of brain stem: Secondary | ICD-10-CM | POA: Diagnosis not present

## 2020-02-07 DIAGNOSIS — F314 Bipolar disorder, current episode depressed, severe, without psychotic features: Secondary | ICD-10-CM | POA: Diagnosis not present

## 2020-02-07 DIAGNOSIS — F411 Generalized anxiety disorder: Secondary | ICD-10-CM | POA: Diagnosis not present

## 2020-02-28 DIAGNOSIS — F314 Bipolar disorder, current episode depressed, severe, without psychotic features: Secondary | ICD-10-CM | POA: Diagnosis not present

## 2020-02-28 DIAGNOSIS — F411 Generalized anxiety disorder: Secondary | ICD-10-CM | POA: Diagnosis not present

## 2020-03-12 DIAGNOSIS — F314 Bipolar disorder, current episode depressed, severe, without psychotic features: Secondary | ICD-10-CM | POA: Diagnosis not present

## 2020-03-12 DIAGNOSIS — F411 Generalized anxiety disorder: Secondary | ICD-10-CM | POA: Diagnosis not present

## 2020-11-18 IMAGING — US US THYROID
1 series · 13 of 25 positions shown · non-contrast
Comparison: 09/19/2017

CLINICAL DATA: Thyroid nodules

EXAM:
THYROID ULTRASOUND
TECHNIQUE: Ultrasound examination of the thyroid gland and adjacent soft
tissues was performed.

[Series 1: us thyroid · 0.06mm/px · 13 of 63 slices shown]
[im 1/63]
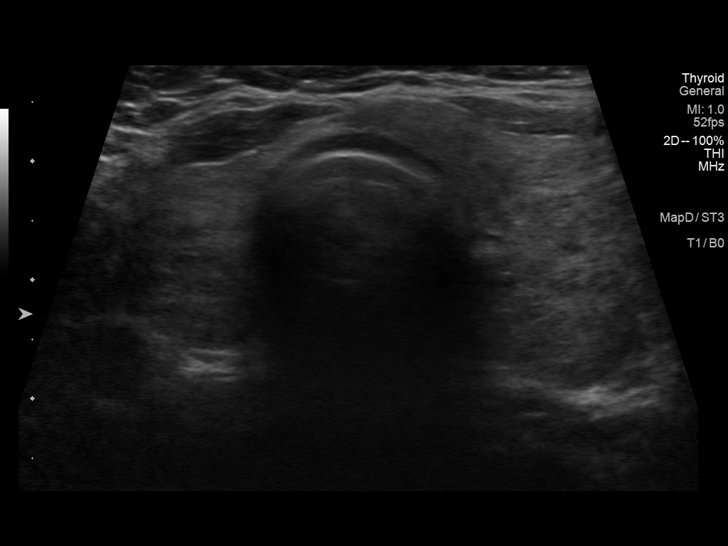
[im 6/63]
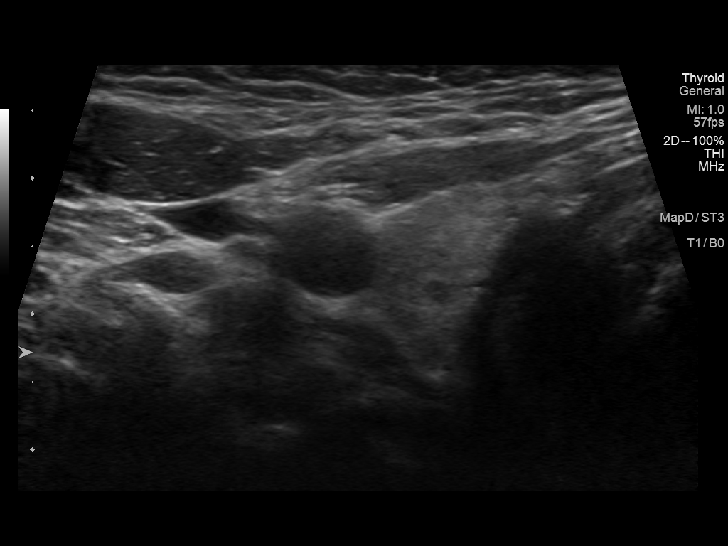
[im 11/63]
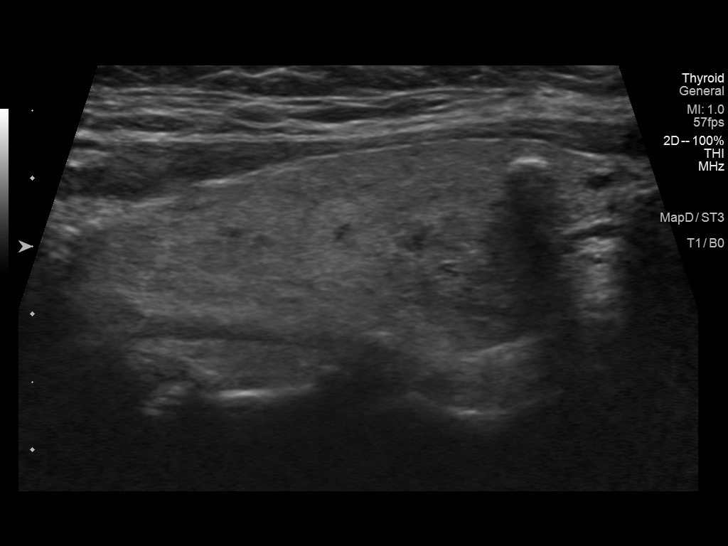
[im 16/63]
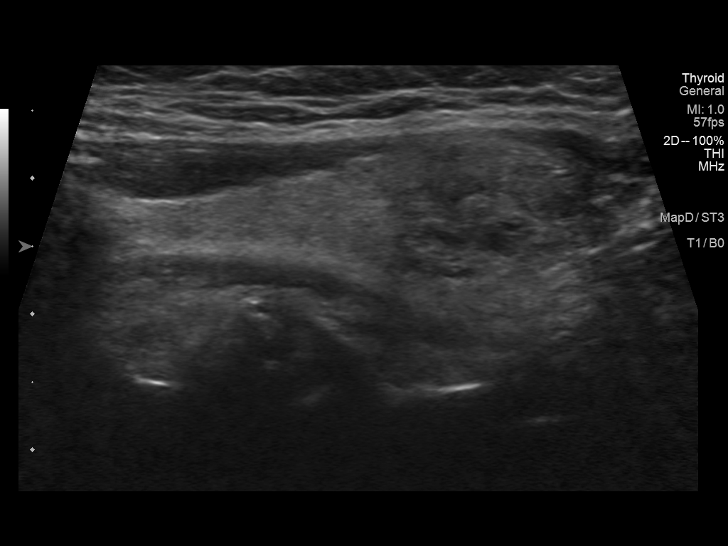
[im 21/63]
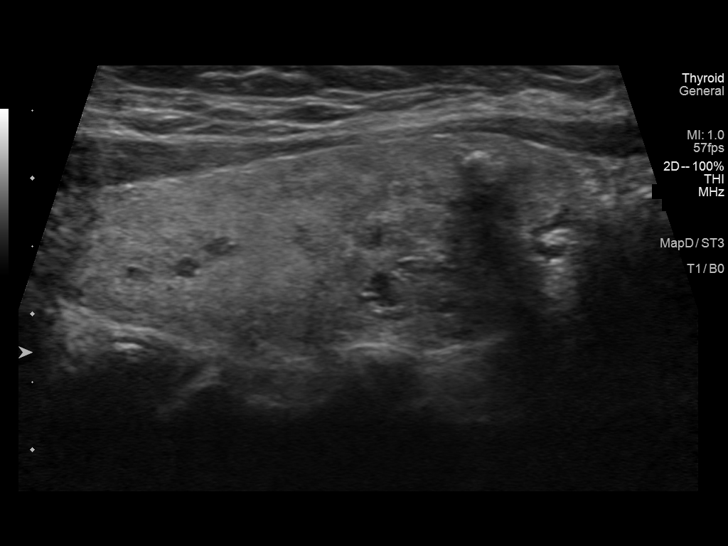
[im 26/63]
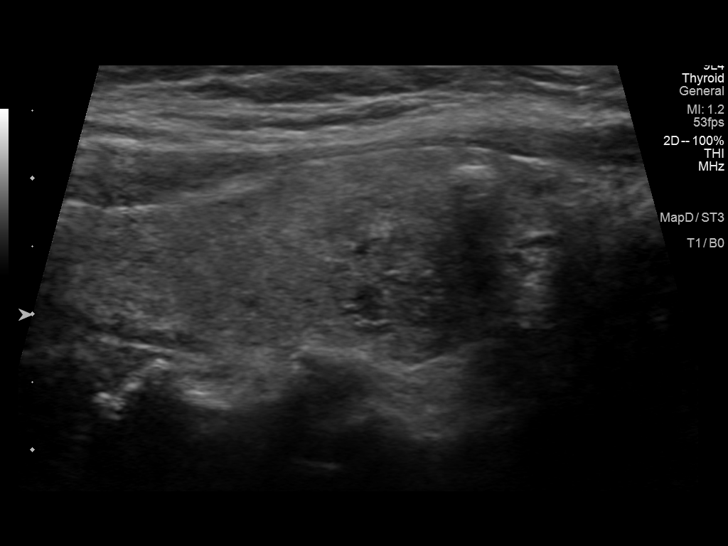
[im 32/63]
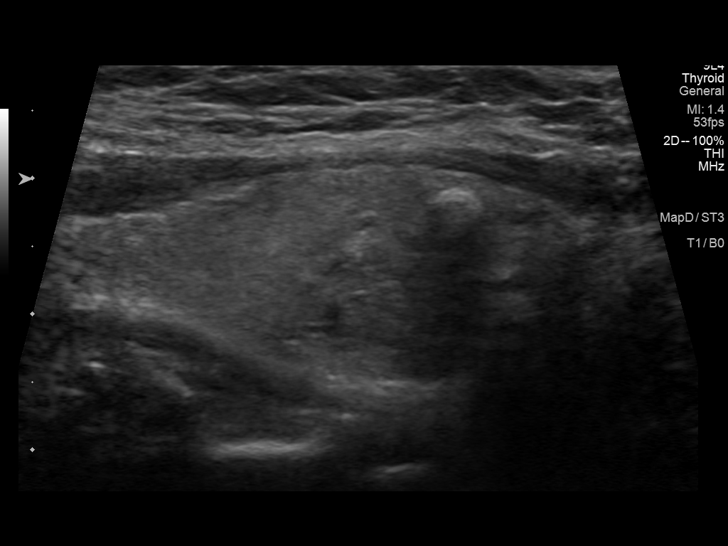
[im 37/63]
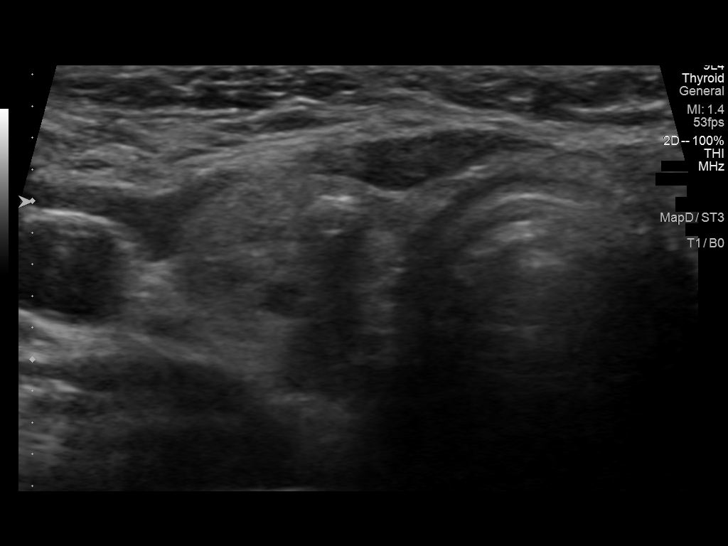
[im 42/63]
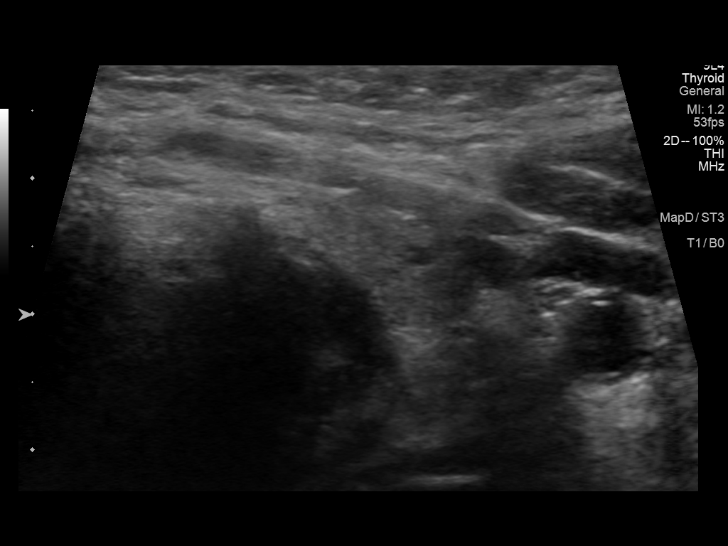
[im 47/63]
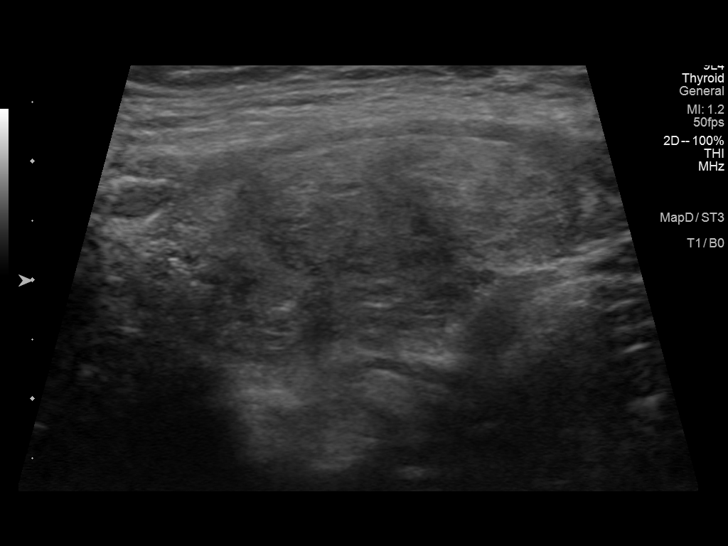
[im 52/63]
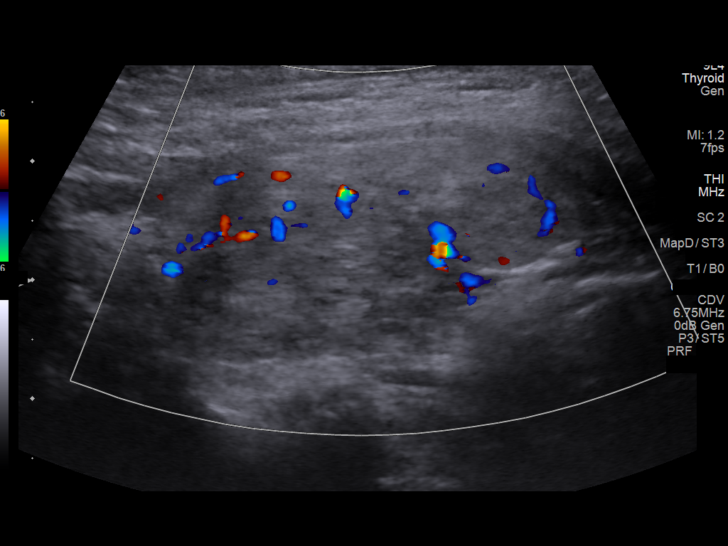
[im 57/63]
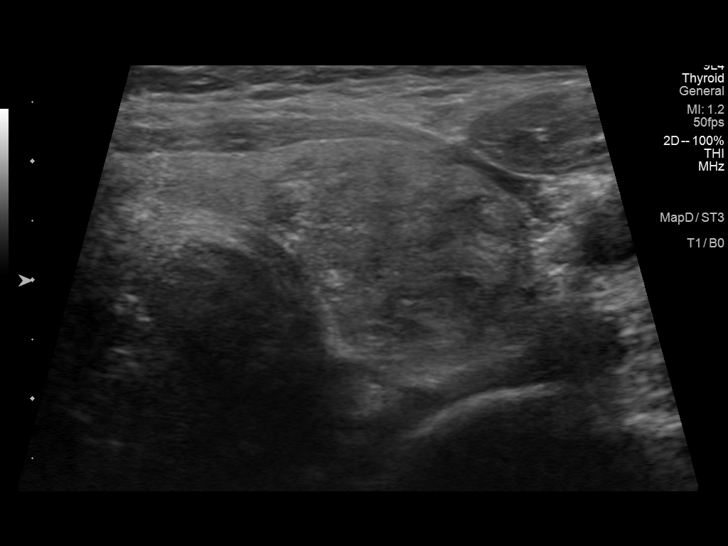
[im 63/63]
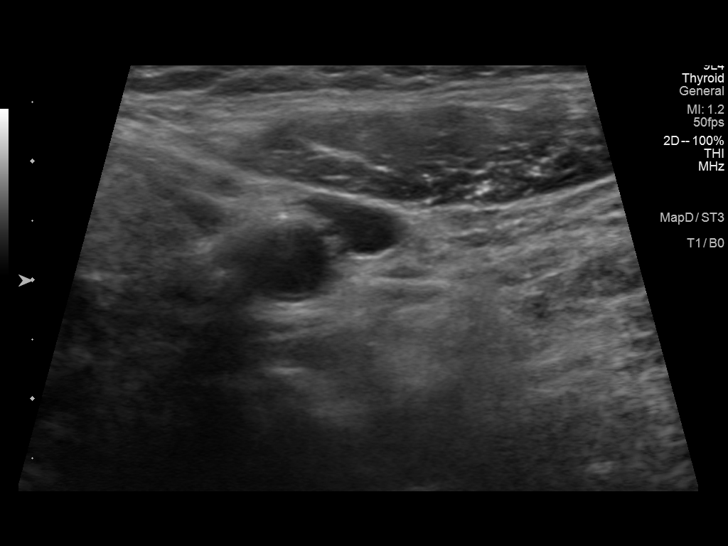

[13 of 25 positions shown; findings below may reference images not displayed]

FINDINGS: Parenchymal Echotexture: Moderately heterogenous

Isthmus: 3 mm

Right lobe: 4.2 x 1.6 x 1.5 cm, previously 4.5 x 1.8 x 2.0 cm

Left lobe: 3.8 x 1.7 x 2.4 cm, previously 4.1 x 2.0 x 2.5 cm

_________________________________________________________

Estimated total number of nodules >/= 1 cm: 2

Number of spongiform nodules >/=  2 cm not described below (TR1): 0

Number of mixed cystic and solid nodules >/= 1.5 cm not described
below (TR2): 0

_________________________________________________________

Stable right mid thyroid TR 4 nodule measures 1.4 x 1.0 x 1.2 cm,
previously 1.4 x 1.0 x 1.1 cm. This nodule warrants continued
follow-up annually.

The previously biopsied left mid thyroid nodule measures 2.7 x 1.7 x
2.1 cm, previously 3.4 x 2.0 x 2.3 cm. Correlate with prior
pathology.

Stable thyroid heterogeneity and 5 mm right inferior pole dystrophic
calcification. No hypervascularity. No new nodule. No regional
adenopathy.
IMPRESSION: Stable 1.4 cm right mid thyroid TR 4 nodule meets criteria for
continued annual follow-up.

Previously biopsied left mid thyroid nodule is smaller. Correlate
with prior pathology.

No new thyroid nodule demonstrated.

The above is in keeping with the ACR TI-RADS recommendations - [HOSPITAL] 3880;[DATE].
